# Patient Record
Sex: Female | Born: 1965 | Race: Black or African American | Hispanic: No | Smoking: Never smoker
Health system: Southern US, Community
[De-identification: ages and names within clinical notes are randomized; demographics above are authoritative.]

## PROBLEM LIST (undated history)

## (undated) DIAGNOSIS — Z87442 Personal history of urinary calculi: Secondary | ICD-10-CM

## (undated) DIAGNOSIS — J302 Other seasonal allergic rhinitis: Secondary | ICD-10-CM

## (undated) DIAGNOSIS — M1990 Unspecified osteoarthritis, unspecified site: Secondary | ICD-10-CM

## (undated) DIAGNOSIS — K219 Gastro-esophageal reflux disease without esophagitis: Secondary | ICD-10-CM

## (undated) DIAGNOSIS — T7840XA Allergy, unspecified, initial encounter: Secondary | ICD-10-CM

## (undated) DIAGNOSIS — N3010 Interstitial cystitis (chronic) without hematuria: Secondary | ICD-10-CM

## (undated) DIAGNOSIS — R51 Headache: Secondary | ICD-10-CM

## (undated) DIAGNOSIS — R519 Headache, unspecified: Secondary | ICD-10-CM

## (undated) DIAGNOSIS — F419 Anxiety disorder, unspecified: Secondary | ICD-10-CM

## (undated) HISTORY — PX: OTHER SURGICAL HISTORY: SHX169

## (undated) HISTORY — PX: FOOT SURGERY: SHX648

## (undated) HISTORY — DX: Allergy, unspecified, initial encounter: T78.40XA

---

## 1999-01-05 ENCOUNTER — Other Ambulatory Visit: Admission: RE | Admit: 1999-01-05 | Discharge: 1999-01-05 | Payer: Self-pay | Admitting: *Deleted

## 1999-05-25 ENCOUNTER — Inpatient Hospital Stay (HOSPITAL_COMMUNITY): Admission: EM | Admit: 1999-05-25 | Discharge: 1999-05-25 | Payer: Self-pay | Admitting: Obstetrics and Gynecology

## 1999-12-24 ENCOUNTER — Other Ambulatory Visit: Admission: RE | Admit: 1999-12-24 | Discharge: 1999-12-24 | Payer: Self-pay | Admitting: Obstetrics and Gynecology

## 2000-12-12 ENCOUNTER — Encounter: Payer: Self-pay | Admitting: Obstetrics and Gynecology

## 2000-12-12 ENCOUNTER — Ambulatory Visit (HOSPITAL_COMMUNITY): Admission: RE | Admit: 2000-12-12 | Discharge: 2000-12-12 | Payer: Self-pay | Admitting: Obstetrics and Gynecology

## 2001-05-08 ENCOUNTER — Encounter (INDEPENDENT_AMBULATORY_CARE_PROVIDER_SITE_OTHER): Payer: Self-pay

## 2001-05-08 ENCOUNTER — Ambulatory Visit (HOSPITAL_COMMUNITY): Admission: RE | Admit: 2001-05-08 | Discharge: 2001-05-08 | Payer: Self-pay | Admitting: Obstetrics and Gynecology

## 2001-09-29 ENCOUNTER — Other Ambulatory Visit: Admission: RE | Admit: 2001-09-29 | Discharge: 2001-09-29 | Payer: Self-pay | Admitting: Internal Medicine

## 2002-05-25 ENCOUNTER — Encounter: Payer: Self-pay | Admitting: Internal Medicine

## 2002-05-25 ENCOUNTER — Encounter: Admission: RE | Admit: 2002-05-25 | Discharge: 2002-05-25 | Payer: Self-pay | Admitting: Internal Medicine

## 2002-07-01 ENCOUNTER — Emergency Department (HOSPITAL_COMMUNITY): Admission: EM | Admit: 2002-07-01 | Discharge: 2002-07-02 | Payer: Self-pay | Admitting: Emergency Medicine

## 2002-09-25 ENCOUNTER — Other Ambulatory Visit: Admission: RE | Admit: 2002-09-25 | Discharge: 2002-09-25 | Payer: Self-pay | Admitting: Obstetrics and Gynecology

## 2003-07-22 ENCOUNTER — Ambulatory Visit (HOSPITAL_COMMUNITY): Admission: RE | Admit: 2003-07-22 | Discharge: 2003-07-22 | Payer: Self-pay | Admitting: Obstetrics & Gynecology

## 2003-07-22 ENCOUNTER — Encounter (INDEPENDENT_AMBULATORY_CARE_PROVIDER_SITE_OTHER): Payer: Self-pay | Admitting: Specialist

## 2003-09-05 ENCOUNTER — Inpatient Hospital Stay (HOSPITAL_COMMUNITY): Admission: AD | Admit: 2003-09-05 | Discharge: 2003-09-05 | Payer: Self-pay | Admitting: Obstetrics and Gynecology

## 2004-01-25 ENCOUNTER — Emergency Department (HOSPITAL_COMMUNITY): Admission: EM | Admit: 2004-01-25 | Discharge: 2004-01-26 | Payer: Self-pay | Admitting: *Deleted

## 2004-07-31 ENCOUNTER — Inpatient Hospital Stay (HOSPITAL_COMMUNITY): Admission: AD | Admit: 2004-07-31 | Discharge: 2004-07-31 | Payer: Self-pay | Admitting: Obstetrics and Gynecology

## 2004-08-05 ENCOUNTER — Inpatient Hospital Stay (HOSPITAL_COMMUNITY): Admission: AD | Admit: 2004-08-05 | Discharge: 2004-08-05 | Payer: Self-pay | Admitting: Obstetrics and Gynecology

## 2004-08-06 ENCOUNTER — Inpatient Hospital Stay (HOSPITAL_COMMUNITY): Admission: AD | Admit: 2004-08-06 | Discharge: 2004-08-10 | Payer: Self-pay | Admitting: Obstetrics and Gynecology

## 2004-08-07 ENCOUNTER — Encounter (INDEPENDENT_AMBULATORY_CARE_PROVIDER_SITE_OTHER): Payer: Self-pay | Admitting: Specialist

## 2005-05-26 ENCOUNTER — Encounter: Admission: RE | Admit: 2005-05-26 | Discharge: 2005-05-26 | Payer: Self-pay | Admitting: Internal Medicine

## 2006-07-29 ENCOUNTER — Encounter: Admission: RE | Admit: 2006-07-29 | Discharge: 2006-07-29 | Payer: Self-pay | Admitting: Allergy and Immunology

## 2006-08-03 ENCOUNTER — Encounter: Admission: RE | Admit: 2006-08-03 | Discharge: 2006-08-03 | Payer: Self-pay | Admitting: Allergy and Immunology

## 2006-08-18 ENCOUNTER — Ambulatory Visit: Payer: Self-pay | Admitting: Internal Medicine

## 2007-01-23 ENCOUNTER — Encounter: Admission: RE | Admit: 2007-01-23 | Discharge: 2007-01-23 | Payer: Self-pay | Admitting: Gastroenterology

## 2007-02-07 ENCOUNTER — Encounter: Admission: RE | Admit: 2007-02-07 | Discharge: 2007-02-07 | Payer: Self-pay | Admitting: Gastroenterology

## 2007-03-24 ENCOUNTER — Encounter (INDEPENDENT_AMBULATORY_CARE_PROVIDER_SITE_OTHER): Payer: Self-pay | Admitting: Urology

## 2007-03-24 ENCOUNTER — Ambulatory Visit (HOSPITAL_BASED_OUTPATIENT_CLINIC_OR_DEPARTMENT_OTHER): Admission: RE | Admit: 2007-03-24 | Discharge: 2007-03-24 | Payer: Self-pay | Admitting: Urology

## 2007-05-20 ENCOUNTER — Emergency Department (HOSPITAL_COMMUNITY): Admission: EM | Admit: 2007-05-20 | Discharge: 2007-05-20 | Payer: Self-pay | Admitting: Family Medicine

## 2007-06-23 ENCOUNTER — Encounter: Admission: RE | Admit: 2007-06-23 | Discharge: 2007-06-23 | Payer: Self-pay | Admitting: Internal Medicine

## 2007-07-04 ENCOUNTER — Encounter: Admission: RE | Admit: 2007-07-04 | Discharge: 2007-07-04 | Payer: Self-pay | Admitting: Internal Medicine

## 2007-08-17 ENCOUNTER — Emergency Department (HOSPITAL_COMMUNITY): Admission: EM | Admit: 2007-08-17 | Discharge: 2007-08-17 | Payer: Self-pay | Admitting: Family Medicine

## 2008-07-19 ENCOUNTER — Encounter: Admission: RE | Admit: 2008-07-19 | Discharge: 2008-07-19 | Payer: Self-pay | Admitting: Obstetrics and Gynecology

## 2008-12-22 IMAGING — CT CT ABDOMEN W/ CM
2 of 5 series · 17 of 46 positions shown, 19 images · IV contrast (READICAT/WATER & [ID] OMNI 300)
Comparison: None.

CLINICAL DATA: Abdominal pain, suprapubic pain.  
ABDOMEN CT WITH CONTRAST:
TECHNIQUE: Multidetector CT imaging of the abdomen was performed following the standard protocol during bolus administration of intravenous contrast.
Contrast:  100 cc Omnipaque 300
TECHNIQUE: Multidetector CT imaging of the pelvis was performed following the standard protocol during bolus administration of intravenous contrast.

[Series 3: routine abdomen · axial · 0.70mm/px · z∈[-400,-50]mm · 14 of 80 slices shown, 16 images]
[im 5/80  soft-tissue]
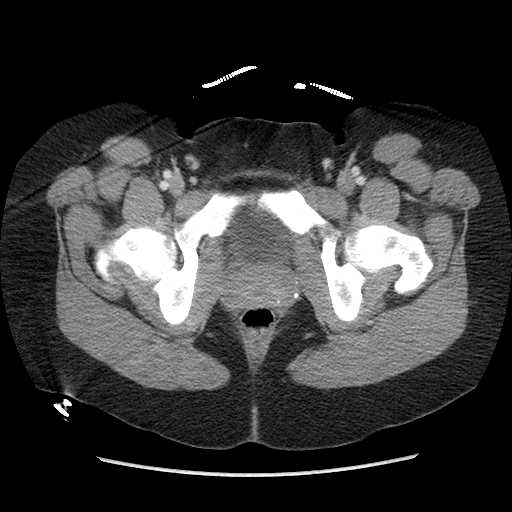
[im 5/80  bone]
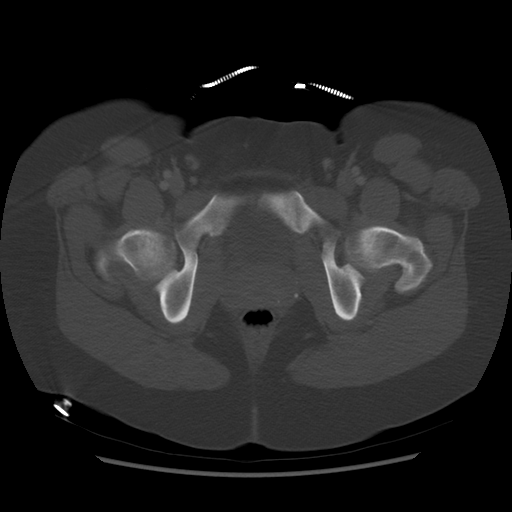
[im 10/80  soft-tissue]
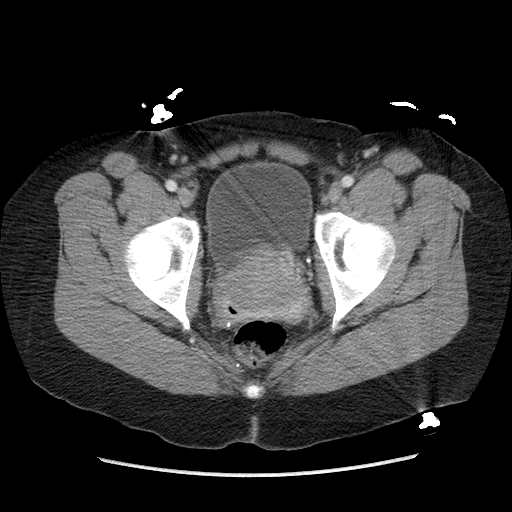
[im 14/80  soft-tissue]
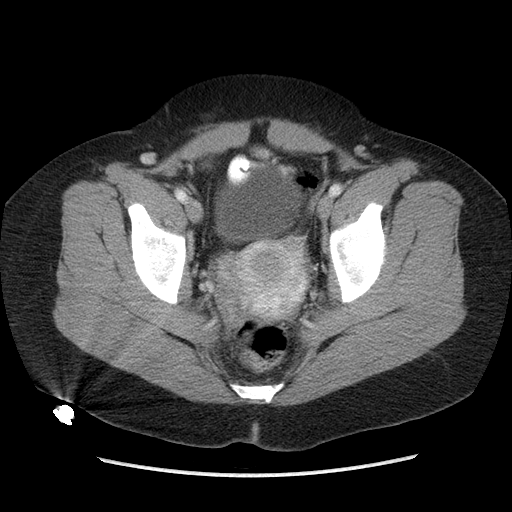
[im 24/80  soft-tissue]
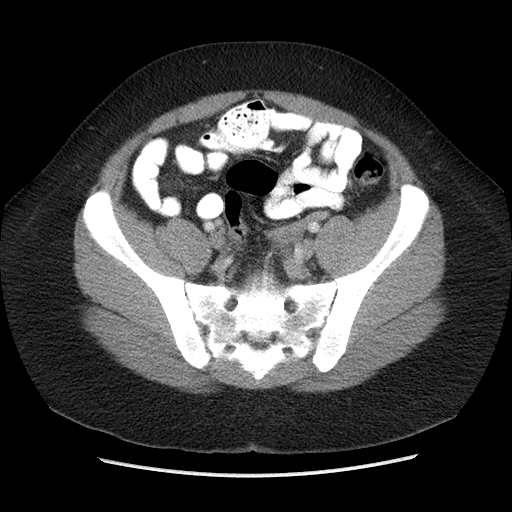
[im 28/80  soft-tissue]
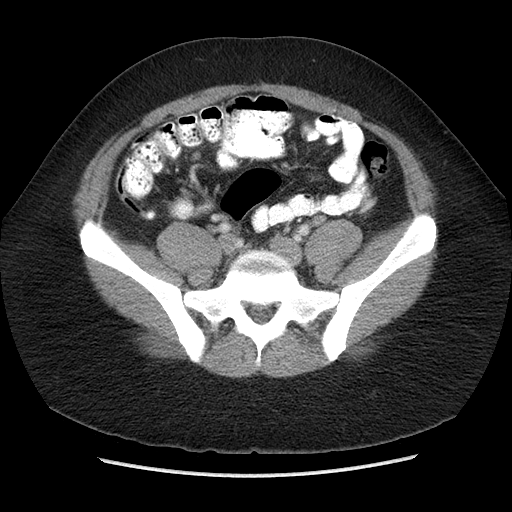
[im 33/80  soft-tissue]
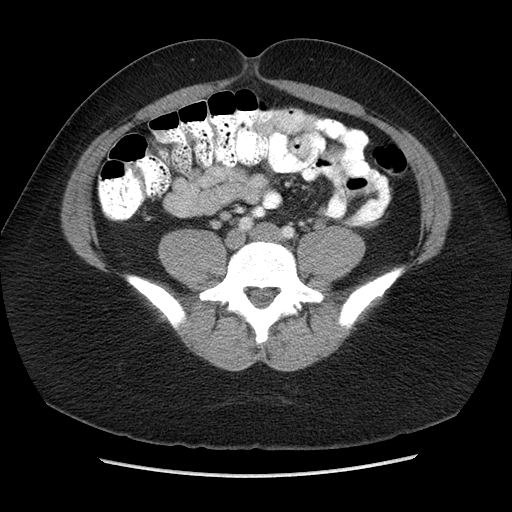
[im 38/80  soft-tissue]
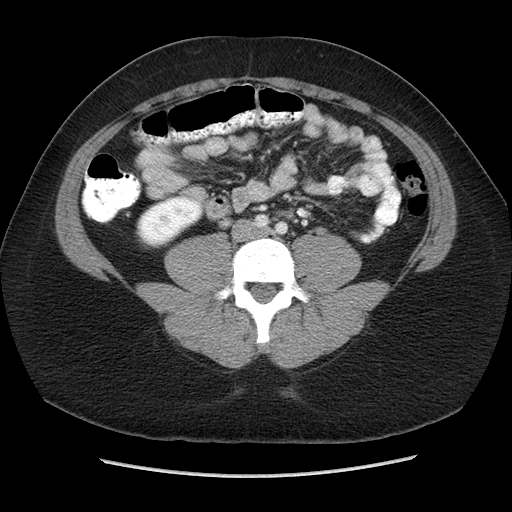
[im 42/80  soft-tissue]
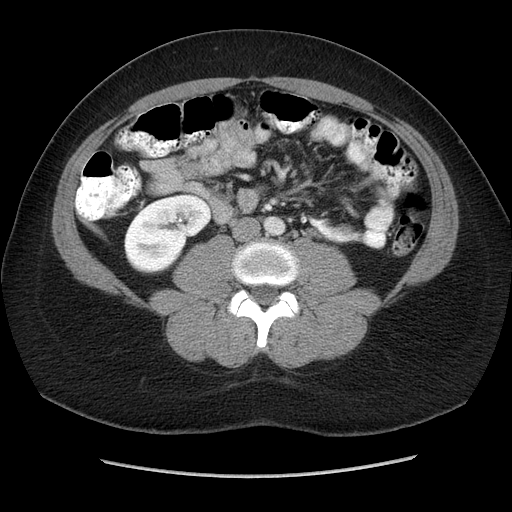
[im 47/80  soft-tissue]
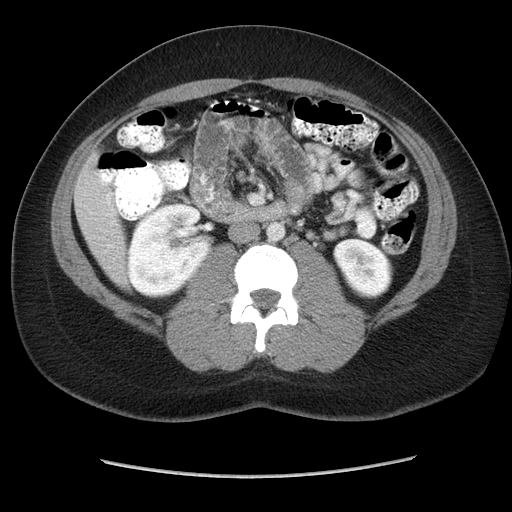
[im 47/80  bone]
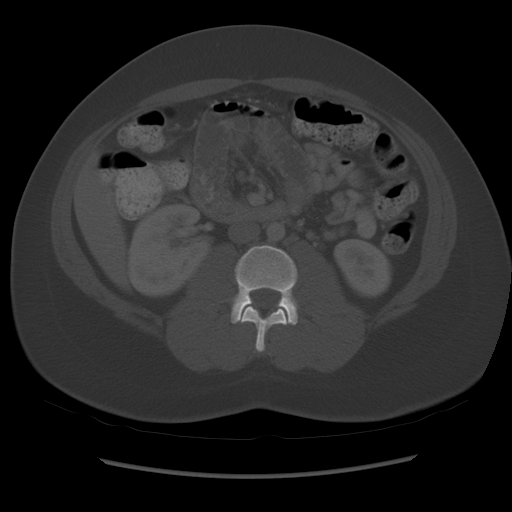
[im 52/80  soft-tissue]
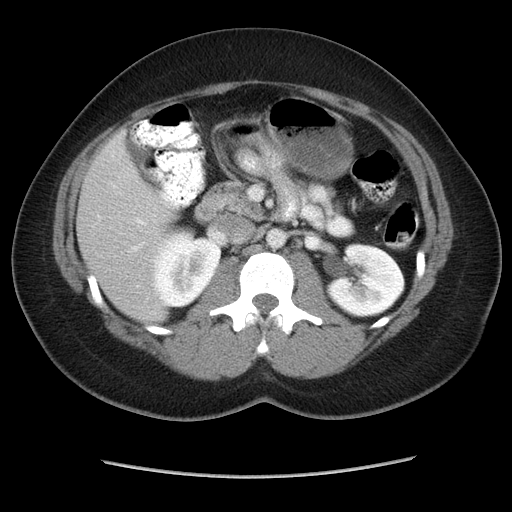
[im 61/80  soft-tissue]
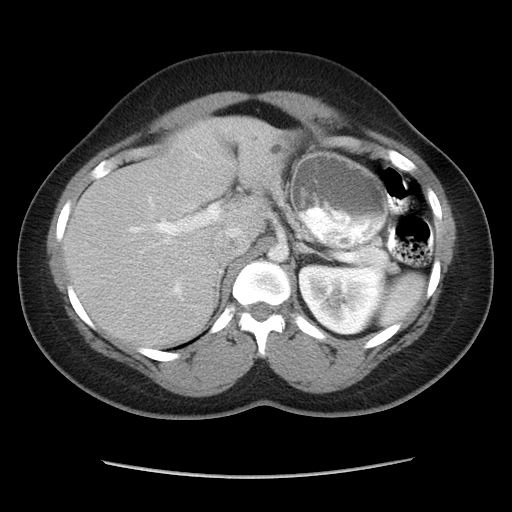
[im 66/80  soft-tissue]
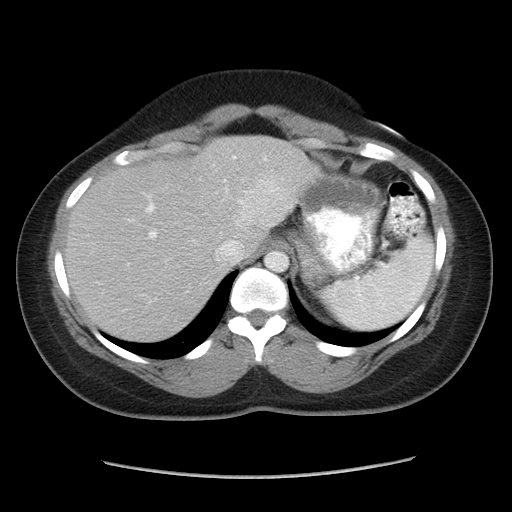
[im 70/80  soft-tissue]
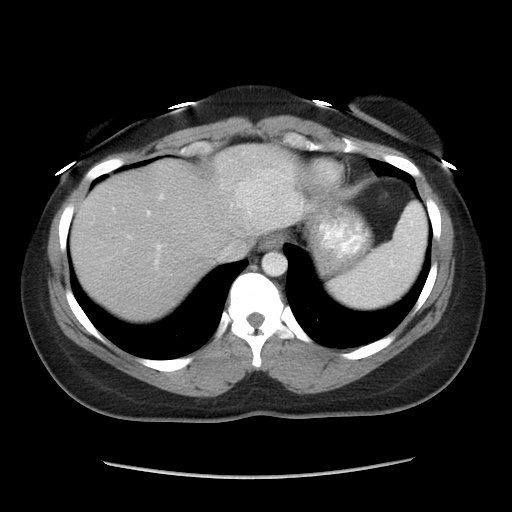
[im 75/80  soft-tissue]
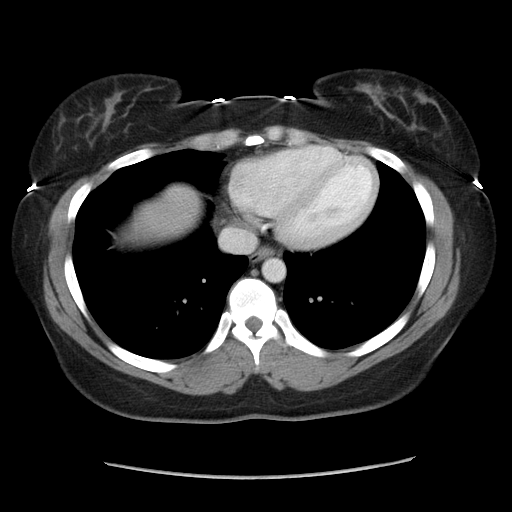

[Series 602: sagittal body · sagittal · 0.87mm/px · 3 of 145 slices shown]
[im 49/145  soft-tissue]
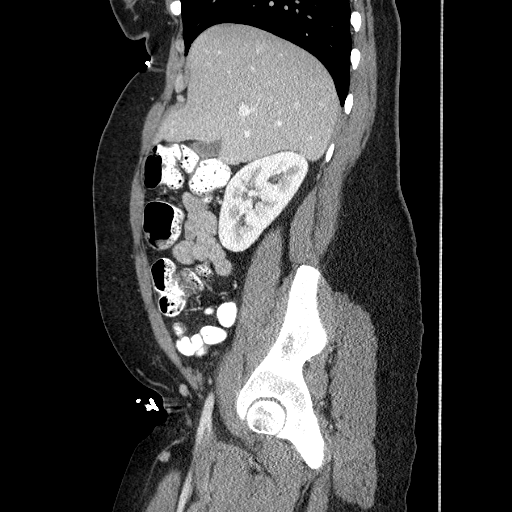
[im 65/145  soft-tissue]
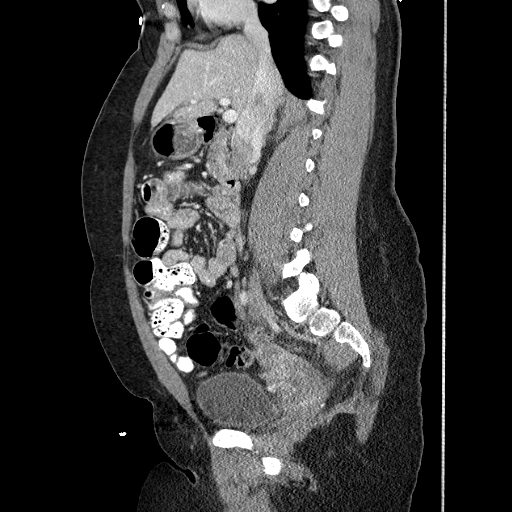
[im 81/145  soft-tissue]
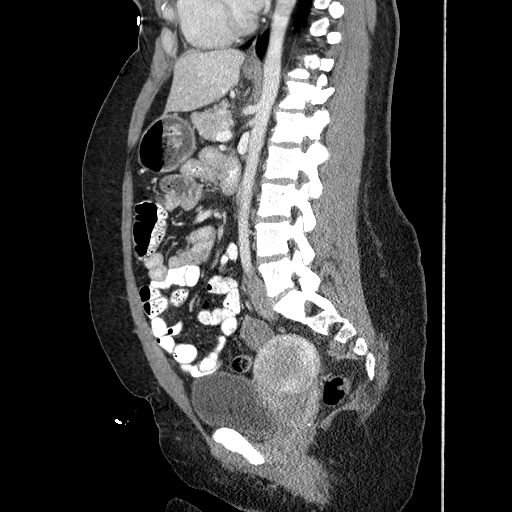

[17 of 46 positions shown; findings below may reference images not displayed]

FINDINGS: 5 mm low attenuation lesion in the left hepatic lobe is likely a cyst.  5.4 x 3.7 cm hyperattenuating lesion in the left hepatic lobe is seen w/ increased attenuation of the surrounding liver.  The liver, gallbladder, and right kidney are otherwise unremarkable.  There may be a small low attenuation lesion in the upper pole of the left kidney which is difficult to fully characterize on today?s study.  The spleen, pancreas, stomach, and small bowel unremarkable.  There are scattered unenlarged lymph nodes in the abdomen.
IMPRESSION: 1.  Hypervascular left hepatic lobe lesion with peripheral perfusion anomaly.  Recommend MR in further evaluation.
PELVIS CT WITH CONTRAST:
FINDINGS: Colon and appendix are unremarkable.  Uterine fundus and body appear distorted.  Ovaries are visualized.  No pathologically enlarged lymph nodes.  No free fluid.  No worrisome  lytic or sclerotic lesions.
IMPRESSION: Question uterine fullness/mass.  Ultrasound recommended.

## 2009-05-21 ENCOUNTER — Encounter: Admission: RE | Admit: 2009-05-21 | Discharge: 2009-05-21 | Payer: Self-pay | Admitting: Internal Medicine

## 2009-12-28 ENCOUNTER — Emergency Department (HOSPITAL_COMMUNITY): Admission: EM | Admit: 2009-12-28 | Discharge: 2009-12-28 | Payer: Self-pay | Admitting: Emergency Medicine

## 2010-06-12 ENCOUNTER — Encounter: Admission: RE | Admit: 2010-06-12 | Discharge: 2010-06-12 | Payer: Self-pay | Admitting: Internal Medicine

## 2010-08-07 ENCOUNTER — Encounter
Admission: RE | Admit: 2010-08-07 | Discharge: 2010-08-07 | Payer: Self-pay | Source: Home / Self Care | Attending: Internal Medicine | Admitting: Internal Medicine

## 2010-09-27 LAB — URINALYSIS, ROUTINE W REFLEX MICROSCOPIC
Bilirubin Urine: NEGATIVE
Glucose, UA: NEGATIVE mg/dL
Hgb urine dipstick: NEGATIVE
Ketones, ur: NEGATIVE mg/dL
Leukocytes, UA: NEGATIVE
Nitrite: NEGATIVE
Protein, ur: 100 mg/dL — AB
Specific Gravity, Urine: 1.023 (ref 1.005–1.030)
Urobilinogen, UA: 0.2 mg/dL (ref 0.0–1.0)
pH: 8.5 — ABNORMAL HIGH (ref 5.0–8.0)

## 2010-09-27 LAB — LIPASE, BLOOD: Lipase: 25 U/L (ref 11–59)

## 2010-09-27 LAB — URINE MICROSCOPIC-ADD ON

## 2010-09-27 LAB — POCT PREGNANCY, URINE: Preg Test, Ur: NEGATIVE

## 2010-09-27 LAB — COMPREHENSIVE METABOLIC PANEL
ALT: 14 U/L (ref 0–35)
AST: 16 U/L (ref 0–37)
Albumin: 3.6 g/dL (ref 3.5–5.2)
Alkaline Phosphatase: 71 U/L (ref 39–117)
BUN: 10 mg/dL (ref 6–23)
CO2: 21 mEq/L (ref 19–32)
Calcium: 9 mg/dL (ref 8.4–10.5)
Chloride: 107 mEq/L (ref 96–112)
Creatinine, Ser: 0.72 mg/dL (ref 0.4–1.2)
GFR calc Af Amer: >60 mL/min (ref >60)
GFR calc non Af Amer: >60 mL/min (ref >60)
Glucose, Bld: 122 mg/dL — ABNORMAL HIGH (ref 70–99)
Potassium: 3.6 mEq/L (ref 3.5–5.1)
Sodium: 136 mEq/L (ref 135–145)
Total Bilirubin: 0.5 mg/dL (ref 0.3–1.2)
Total Protein: 6.7 g/dL (ref 6.0–8.3)

## 2010-09-27 LAB — CBC
HCT: 41.4 % (ref 36.0–46.0)
Hemoglobin: 13.6 g/dL (ref 12.0–15.0)
MCHC: 32.8 g/dL (ref 30.0–36.0)
MCV: 87.3 fL (ref 78.0–100.0)
Platelets: 250 10*3/uL (ref 150–400)
RBC: 4.75 MIL/uL (ref 3.87–5.11)
RDW: 13.1 % (ref 11.5–15.5)
WBC: 9.6 10*3/uL (ref 4.0–10.5)

## 2010-09-27 LAB — DIFFERENTIAL
Basophils Absolute: 0 10*3/uL (ref 0.0–0.1)
Basophils Relative: 0 % (ref 0–1)
Eosinophils Absolute: 0.1 10*3/uL (ref 0.0–0.7)
Eosinophils Relative: 1 % (ref 0–5)
Lymphocytes Relative: 38 % (ref 12–46)
Lymphs Abs: 3.7 10*3/uL (ref 0.7–4.0)
Monocytes Absolute: 0.6 10*3/uL (ref 0.1–1.0)
Monocytes Relative: 6 % (ref 3–12)
Neutro Abs: 5.2 10*3/uL (ref 1.7–7.7)
Neutrophils Relative %: 54 % (ref 43–77)

## 2010-09-27 LAB — WET PREP, GENITAL
Clue Cells Wet Prep HPF POC: NONE SEEN
Trich, Wet Prep: NONE SEEN
Yeast Wet Prep HPF POC: NONE SEEN

## 2010-09-27 LAB — GC/CHLAMYDIA PROBE AMP, GENITAL
Chlamydia, DNA Probe: NEGATIVE
GC Probe Amp, Genital: NEGATIVE

## 2010-11-11 ENCOUNTER — Other Ambulatory Visit: Payer: Self-pay | Admitting: Obstetrics and Gynecology

## 2010-11-24 NOTE — Op Note (Signed)
Samantha Mcgee, Samantha Mcgee            ACCOUNT NO.:  [id number]   MEDICAL RECORD NO.:  [id number]          PATIENT TYPE:  AMB   LOCATION:  [facility]                         FACILITY:  [facility]   PHYSICIAN:  Sigmund I. Patsi Sears, M.D.DATE OF BIRTH:  September 21, 1965   DATE OF PROCEDURE:  03/24/2007  DATE OF DISCHARGE:                               OPERATIVE REPORT   PREOPERATIVE DIAGNOSIS:  Interstitial cystitis.   POSTOPERATIVE DIAGNOSIS:  Interstitial cystitis.   OPERATION:  1. Cystourethroscopy.  2. Hydrodistention of the bladder.  3. Bladder biopsy.  4. Cauterization of the biopsy sites.  5. Instillation of Marcaine and Pyridium in the bladder.  6. Injection of Marcaine and Kenalog in the subtrigonal space.   SURGEON:  Sigmund I. Patsi Sears, M.D.   PREPARATION:  After appropriate preanesthesia, the patient was brought  to the operating room, placed in the operating room in a dorsal supine  position, where general LMA anesthesia was introduced.  She was replaced  in dorsal lithotomy position, where the pubis was prepped with Betadine  solution and draped in the usual fashion.   REVIEW OF HISTORY:  [name] is a 45 year old female complaining of low  back pain, nausea, abdominal cramping, with mild urge and urge  incontinence.  She had an O'Leary-Sant symptom score sheet of 9.  She is  now for cystoscopy, HOD, bladder biopsy   PROCEDURE:  Cystourethroscopy was accomplished, which shows a normal-  appearing urethra.  Cystoscopy shows normal bladder with 800-mL  capacity, stretched to 850 mL with hydrodistention.  Cold-cup bladder  biopsies taken and cauterization of the biopsy sites is obtained.  The  bladder wall appears normal with minimal amount of ulcerations.  Pyridium and Marcaine is instilled bladder, and Marcaine and Kenalog is  injected in the subtrigonal space.  The patient tolerated the procedure  well.  She was given Toradol IV.  She received a B&O suppository as  well.      Sigmund I. Patsi Sears, M.D.  Electronically Signed     SIT/MEDQ  D:  03/24/2007  T:  03/25/2007  Job:  [id number]

## 2010-11-27 NOTE — Discharge Summary (Signed)
Samantha Mcgee, Samantha Mcgee            ACCOUNT NO.:  [id number]   MEDICAL RECORD NO.:  [id number]          PATIENT TYPE:  INP   LOCATION:  9148                          FACILITY:  [facility]   PHYSICIAN:  Genia Del, M.D.DATE OF BIRTH:  08/11/1965   DATE OF ADMISSION:  08/06/2004  DATE OF DISCHARGE:  08/10/2004                                 DISCHARGE SUMMARY   ADMISSION DIAGNOSES:  1.  Thirty-eight-plus weeks.  2.  Polyhydramnios.  3.  Repetitive deep prolonged decelerations.  4.  Failure to progress.   DISCHARGE DIAGNOSES:  1.  Thirty-eight-plus weeks.  2.  Polyhydramnios.  3.  Repetitive deep prolonged decelerations.  4.  Failure to progress.  5.  Intervention, urgent low transverse primary cesarean section.  6.  Birth of a baby girl, Apgars 9 and 9, pH 7.26.   HOSPITAL COURSE:  Normal hemoglobin 10.3.  The patient was discharged in  good status on postoperative day #3.  Discharge advice was given. The  patient was prescribed Motrin and Tylox p.r.n.  Prenatal vitamins and iron  supplements were suggested.  She will follow up postoperative in four weeks  at the office.      ML/MEDQ  D:  09/03/2004  T:  09/03/2004  Job:  [id number]

## 2010-11-27 NOTE — Op Note (Signed)
Samantha Mcgee, Samantha Mcgee            ACCOUNT NO.:  [id number]   MEDICAL RECORD NO.:  [id number]          PATIENT TYPE:  INP   LOCATION:  9148                          FACILITY:  [facility]   PHYSICIAN:  Genia Del, M.D.DATE OF BIRTH:  Feb 13, 1966   DATE OF PROCEDURE:  08/07/2004  DATE OF DISCHARGE:                                 OPERATIVE REPORT   OPERATIVE PROTOCOL:  1.  38+ weeks' gestation.  2.  Polyhydramnios.  3.  Repetitive, deep prolonged decelerations.  4.  Failure to progress.   POSTOPERATIVE DIAGNOSES:  1.  38+ weeks' gestation.  2.  Polyhydramnios.  3.  Repetitive, deep prolonged decelerations.  4.  Failure to progress.   INTERVENTION:  Urgent, low-transverse C-section, primary.   SURGEON:  Genia Del, M.D.   ASSISTANT:  Maxie Better, M.D.   ANESTHESIOLOGIST:  Dr. Jean Rosenthal.   PROCEDURE IN DETAIL:  After epidural anesthesia, the patient was placed in  the 15 degree left decubitus position.  She is prepped with Betadine on the  abdominal, suprapubic, vulvar area.  The bladder catheter was already in  place and the patient is draped as usual.  After verifying the level of  anesthesia, we proceeded with a Pfannenstiel incision with scalpel.  We  opened the adipose with the scalpel and the aponeurosis with the scalpel and  further opening of the incision is done on each side with Mayo scissors and  the electrocautery.  We then separated the rectus muscles from the  aponeurosis on the midline superiorly and inferiorly.  We opened the  parietal peritoneum bluntly and then put the bladder retractor in place.  We  opened the visceral peritoneum over the lower uterine segment with the [facility]  scissors.  The bladder is reclined downward.  The low transverse hysterotomy  is done with the scalpel.  We opened on each side with dressing scissors.  The amniotic fluid is clear.  The fetus is in OP presentation.  Birth of a  baby girl.  The cord is clamped and cut.  The  baby is suctioned and given to  the neonatal team.  Apgars are 9 and 9.  pH is 7.26.  The cord blood is  taken.  The placenta is evacuated spontaneously and sent to pathology.  Uterine revision done and Pitocin is started in the IV fluid.  A dose of  Cefotan is given IV.  We examined the uterus.  It is increased in volume,  well contracted.  The increase in volume is due to multiple myoma.  Both  ovaries are normal to inspection.  Both tubes are normal.  We closed the  hysterotomy in a first locked, running suture of Vicryl 0.  We made a second  layer in a mattress fashion with Vicryl 0.  Hemostasis is adequate.  We then  irrigated and suctioned the abdominopelvic cavity.  Hemostasis is completed  on the rectus muscles and aponeurosis with the electrocautery.  We closed  the aponeurosis with two half running sutures of Vicryl 0.  Hemostasis was  completed on the adipose  tissue with the electrocautery.  The skin is reapproximated  with staples.  A  dry dressing is applied.  The count of sponges and instruments was complete  x2.  No complications occurred.  The estimated blood loss was 700 mL.  The  patient was transferred to recovery room in good status.      ML/MEDQ  D:  08/07/2004  T:  08/08/2004  Job:  [id number]

## 2010-11-27 NOTE — Op Note (Signed)
[facility]  Patient:    Samantha Mcgee, Samantha Mcgee Visit Number: [id number] MRN: [id number]          Service Type: DSU Location: [facility] Attending Physician:  Shaune Spittle Proc. Date: 05/08/01 Admit Date:  05/08/2001                             Operative Report  PREOPERATIVE DIAGNOSIS:       Pelvic pain.  POSTOPERATIVE DIAGNOSIS:      Pelvic pain.  Small posterior uterine fibroid, endometriosis, and right ovarian endometrioma.  OPERATION:                    Operative laparoscopy, right ovarian cystectomy, lysis of adhesions.  SURGEON:                      Vanessa P. Haygood, M.D.  ASSISTANT:  ANESTHESIA:                   General orotracheal.  ESTIMATED BLOOD LOSS:         Less than 50 cc.  COMPLICATIONS:                None.  FINDINGS:                     The uterus was upper limits of normal size with a 1 cm myoma on the posterior right fundus.  The tubes were normal bilaterally. The left ovary contained a corpus luteum cyst.  The right ovary contained an endometrioma and was stuck to the right uterosacral ligament with adhesions.  The appendix tip appeared normal, though, the remainder was retrocecal.  DESCRIPTION OF PROCEDURE:     The patient was taken to the operating room and placed on the operating table.  After the attainment of adequate general anesthesia, she was placed in the modified lithotomy position.  The abdomen, perineum, and vagina were prepped with multiple layers of Betadine, and a Foley catheter inserted into the bladder and connected to straight drainage. A single tooth tenaculum was placed on the anterior cervix and the abdomen draped as a sterile field.  Subumbilical injection of 0.25% Marcaine was undertaken as was suprapubic injection for a total of 10 cc.  A subumbilical incision was made and a Veress cannula placed through that incision into the peritoneal cavity.  A pneumoperitoneum was created with 2.5  liters of CO2. The Veress cannula was removed and the laparoscopic trocar placed through that incision into the peritoneal cavity.  The laparoscope was placed through the trocar sleeve.  Suprapubic incisions were made to the right and left of midline and laparoscopic probe trocars placed through those incisions into the peritoneal cavity under direct visualization.  The above noted findings were made and documented.  The right ovary was then dissected off the right uterosacral ligament and elevated into the operative field.  It was held with the grasper and with sharp dissection, the right ovarian endometrioma was excised with a combination of hydrodissection and sharp dissection.  It was then removed from the operative field through the umbilical trocar sleeve. That trocar sleeve was then copiously irrigated.  Copious irrigation of the endometrioma bed revealed that hemostasis was noted to be adequate and a large volume of warm Ringers lactate was left in the peritoneal cavity.  All instruments were then removed from the peritoneal cavity under direct visualization as the  CO2 was allowed to escape.  The subumbilical and suprapubic incisions were then closed with subcuticular sutures of 3-0 Vicryl. The single tooth tenaculum was removed as was the Foley catheter.  The patient was awakened from general anesthesia and taken to the recovery room in stable condition having tolerated the procedure well with sponge, needle, and instrument counts were correct. Attending Physician:  Shaune Spittle DD:  05/08/01 TD:  05/09/01 Job: [id number]

## 2010-11-27 NOTE — Assessment & Plan Note (Signed)
[facility]                             PULMONARY OFFICE NOTE   NAME:Samantha, Mcgee                   MRN:          [id number]  DATE:08/18/2006                            DOB:          12-Jun-1966    REFERRING PHYSICIAN:  Jessica Priest, M.D.   HISTORY:  [age]-year-old black female says she has been bothered  by sweats and fatigue for over a year, but abruptly about a month ago  developed right shoulder discomfort that was worse with deep breathing  and moving her shoulder around on the right.  It was made better by  ibuprofen.  She had one episode of hemoptysis, or what she describes as  traces of bright red blood a few weeks ago, and underwent a chest x-ray  showing a vague infiltrate in the right upper lobe which was an air  space opacity on CT scan, and subsequently was treated with 10 days of  Avelox by Dr. Lucie Leather, and is now seen at Dr. Kathyrn Lass request on the  behalf of Dr. Willa Rough.   The patient says she really has not been doing well over the last year  with symptoms mostly of coughing and dyspnea with no improvement on  Asmanex, Nasonex, Clarinex to date.  She describes herself as having  problems with allergies since she was in her 24s consisting of itching,  sneezing and runny nose, and has been identified as allergic to pollen  and grass but is not on allergy shots at present.   The patient denies any weight loss or documented fever, but just has  saturating sweats pretty much every night.  She has paroxysms of  dyspnea that do not necessarily respond to albuterol, and at times feels  she is choking.   PAST MEDICAL HISTORY:  Significant for asthma, allergies and chronic  headache as well as remote foot surgery.   ALLERGIES:  None known.   MEDICATIONS:  Asmanex, Nasonex, Clarinex, Xopenex, ibuprofen and  Benadryl.   SOCIAL HISTORY:  She quit smoking in 1992.  She works as a Lobbyist.   FAMILY HISTORY:  Recorded  in detail and is significant for asthma and  allergies in her father.  Negative for cancer in any immediate family  members nor tuberculosis.   REVIEW OF SYSTEMS:  Taken in detail on the worksheet and significant for  the problems as outlined above.   PHYSICAL EXAMINATION:  This is a very animated black female, seems to  struggle quite a bit with answering questions in a straightforward  fashion.  She is afebrile, stable vital signs.  HEENT:  Unremarkable, oropharynx is clear.  There was no turbinate edema  or sinus pallor or polyps.  Oropharynx was clear with no excessive  postnasal drainage or cobblestoning.  Dentition was intact.  Ear canals  were clear bilaterally.  NECK:  Supple without cervical adenopathy or tenderness.  The trachea  was midline, no thyromegaly.  LUNGS:  Lung fields are perfectly clear bilaterally to auscultation and  percussion.  CARDIAC:  Appears regular rhythm without murmur, gallop or rub.  ABDOMEN:  Soft, benign.  EXTREMITIES:  Warm without calf tenderness, cyanosis, clubbing or edema.   Chest x-ray was repeated today and is normal.  PFTs were repeated today  and show only an abnormal inspiratory contour.   IMPRESSION:  1. 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      Because she carries the diagnosis of chronic asthma, I am going to      take the liberty of switching her from Asmanex, which is one of the      most irritating of the dry powdered inhalers, to QVAR, which is the      least irritating of the aerosols available, at a dose of QVAR 40      two puffs twice daily.  I spent extra time teaching the patient how      to use this effectively.  2. Because she has so much upper airway instability I recommended      Zegerid 40 milligrams at bedtime for a 4-week trial.  3. I have emphasized that she should continue Nasonex under Dr. Willa Rough'      direction  perfectly regularly and use Allegra or Claritin (but not      both) as needed itching and sneezing, for cough use Delsym and      supplement with tramadol, for wheezing and dyspnea use Xopenex 2      puffs every 4 hours (technique reviewed), and for chest discomfort      use Tylenol or tramadol, but avoid ibuprofen for now to see if the      symptom flares off of ibuprofen (which would probably indicate a      musculoskeletal complaint).   In terms of the infiltrate on chest x-ray and CT scan, since it was  plainly seen before and now is a normal exam, most likely this was  inflammatory.   In terms of the chronic sweats she has, I do not believe they represent  a chronic infection in that she has not had documented fever or weight  loss.  I do note that she has tachycardia and a very unusual affect and  attitude, and wonder if she not be hyperthyroid and/or overusing  decongestants or beta 2 agonists.  I would recommend a CBC with  differential, sed rate and TSH if not already obtained by Dr. Willa Rough (she  tells me blood work was already obtained at Dr. Kathyrn Lass office in the  last 2 weeks, we will check the results of this before we add additional  studies).     Charlaine Dalton. Sherene Sires, MD, [facility]  Electronically Signed    MBW/MedQ  DD: 08/18/2006  DT: 08/19/2006  Job #: [id number]   cc:   Meliton Rattan. Willa Rough, M.D.

## 2010-11-27 NOTE — Op Note (Signed)
NAME:  Samantha Mcgee, Samantha Mcgee                      ACCOUNT NO.:  [id number]   MEDICAL RECORD NO.:  [id number]                   PATIENT TYPE:  AMB   LOCATION:  SDC                                  FACILITY:  [facility]   PHYSICIAN:  Gerri Spore B. Earlene Plater, M.D.               DATE OF BIRTH:  1966-07-08   DATE OF PROCEDURE:  07/22/2003  DATE OF DISCHARGE:                                 OPERATIVE REPORT   PREOPERATIVE DIAGNOSIS:  A 6 week incomplete abortion.   POSTOPERATIVE DIAGNOSIS:  A 6 week incomplete abortion.   PROCEDURE:  Suction curettage, ultrasound guided.   SURGEON:  Chester Holstein. Earlene Plater, M.D.   ANESTHESIA:  MAC and 20 mL of 2% Nesacaine paracervical block.   FINDINGS:  Exam under anesthesia, a 6-7 week size anteverted uterus.  No  masses palpable.  Ultrasound consistent with incomplete AB performed in the  operating room.   SPECIMENS:  Products of conception.   COMPLICATIONS:  None.   INDICATION:  Patient with known 6 weeks pregnancy loss, planned for suction  curettage tomorrow.  Presented by EMS with complaints of severe lower  abdominal pain and some associated bleeding.  Was in tremendous pain and  requested we proceed immediately with a D&C.   The risks of the procedure were discussed including infection, bleeding,  uterine perforation, damage to surrounding organs, and retained products.  All questions answered.  The patient wished to proceed.   DESCRIPTION OF PROCEDURE:  The patient was taken to the operating room and  MAC anesthesia obtained.  She was prepped and draped in a standard fashion.  An exam under anesthesia showed a 6-7 week size, anteverted uterus without  masses palpable.  There was some tissue already through the cervix within  the vaginal vault.  Ultrasound showed evidence of some additional tissue in  the uterus.   Speculum inserted, paracervical block placed, the anterior lip of the cervix  grasped with a single-tooth.  The cervix was already dilated enough  to  permit passage of the #7 curved cannula.  This was performed under  ultrasound and suction used to complete the miscarriage.  The uterus was  then gently curetted, and a small amount of additional tissue returned.  One  more pass of the suction returned no products, and an ultrasound of the  uterus looked completely evacuated.  Therefore, the procedure was  terminated.   The single-tooth was removed, and the cervix was hemostatic.  The patient  was taken to the recovery room, awake and alert in a stable condition.  The  patient's blood type was known to be B positive.                                               Gerri Spore B. Earlene Plater, M.D.    WBD/MEDQ  D:  07/22/2003  T:  07/22/2003  Job:  [id number]

## 2011-04-23 LAB — POCT HEMOGLOBIN-HEMACUE
Hemoglobin: 13.5
Operator id: 11453

## 2011-04-23 LAB — POCT PREGNANCY, URINE
Operator id: 268271
Preg Test, Ur: NEGATIVE

## 2011-05-31 ENCOUNTER — Other Ambulatory Visit: Payer: Self-pay | Admitting: Internal Medicine

## 2011-05-31 DIAGNOSIS — Z1231 Encounter for screening mammogram for malignant neoplasm of breast: Secondary | ICD-10-CM

## 2011-06-22 ENCOUNTER — Ambulatory Visit
Admission: RE | Admit: 2011-06-22 | Discharge: 2011-06-22 | Disposition: A | Discharge status: Home / Self Care | Payer: 59 | Source: Ambulatory Visit | Attending: Internal Medicine | Admitting: Internal Medicine

## 2011-06-22 DIAGNOSIS — Z1231 Encounter for screening mammogram for malignant neoplasm of breast: Secondary | ICD-10-CM

## 2011-08-10 ENCOUNTER — Emergency Department (HOSPITAL_COMMUNITY)
Admission: EM | Admit: 2011-08-10 | Discharge: 2011-08-10 | Disposition: A | Discharge status: Home / Self Care | Payer: 59 | Attending: Emergency Medicine | Admitting: Emergency Medicine

## 2011-08-10 ENCOUNTER — Emergency Department (HOSPITAL_COMMUNITY): Payer: 59

## 2011-08-10 ENCOUNTER — Encounter (HOSPITAL_COMMUNITY): Payer: Self-pay | Admitting: Emergency Medicine

## 2011-08-10 DIAGNOSIS — N200 Calculus of kidney: Secondary | ICD-10-CM | POA: Insufficient documentation

## 2011-08-10 DIAGNOSIS — F329 Major depressive disorder, single episode, unspecified: Secondary | ICD-10-CM | POA: Insufficient documentation

## 2011-08-10 DIAGNOSIS — M545 Low back pain, unspecified: Secondary | ICD-10-CM | POA: Insufficient documentation

## 2011-08-10 DIAGNOSIS — Z79899 Other long term (current) drug therapy: Secondary | ICD-10-CM | POA: Insufficient documentation

## 2011-08-10 DIAGNOSIS — R110 Nausea: Secondary | ICD-10-CM | POA: Insufficient documentation

## 2011-08-10 DIAGNOSIS — F3289 Other specified depressive episodes: Secondary | ICD-10-CM | POA: Insufficient documentation

## 2011-08-10 DIAGNOSIS — N134 Hydroureter: Secondary | ICD-10-CM | POA: Insufficient documentation

## 2011-08-10 DIAGNOSIS — N201 Calculus of ureter: Secondary | ICD-10-CM | POA: Insufficient documentation

## 2011-08-10 DIAGNOSIS — R6883 Chills (without fever): Secondary | ICD-10-CM | POA: Insufficient documentation

## 2011-08-10 DIAGNOSIS — J45909 Unspecified asthma, uncomplicated: Secondary | ICD-10-CM | POA: Insufficient documentation

## 2011-08-10 DIAGNOSIS — R1031 Right lower quadrant pain: Secondary | ICD-10-CM | POA: Insufficient documentation

## 2011-08-10 HISTORY — DX: Other seasonal allergic rhinitis: J30.2

## 2011-08-10 HISTORY — DX: Interstitial cystitis (chronic) without hematuria: N30.10

## 2011-08-10 LAB — URINALYSIS, ROUTINE W REFLEX MICROSCOPIC
Bilirubin Urine: NEGATIVE
Glucose, UA: NEGATIVE mg/dL
Ketones, ur: NEGATIVE mg/dL
Nitrite: NEGATIVE
Protein, ur: 30 mg/dL — AB
Specific Gravity, Urine: 1.025 (ref 1.005–1.030)
Urobilinogen, UA: 0.2 mg/dL (ref 0.0–1.0)
pH: 7 (ref 5.0–8.0)

## 2011-08-10 LAB — CBC
HCT: 35.5 % — ABNORMAL LOW (ref 36.0–46.0)
Hemoglobin: 12 g/dL (ref 12.0–15.0)
MCH: 28 pg (ref 26.0–34.0)
MCHC: 33.8 g/dL (ref 30.0–36.0)
MCV: 82.9 fL (ref 78.0–100.0)
Platelets: 286 10*3/uL (ref 150–400)
RBC: 4.28 MIL/uL (ref 3.87–5.11)
RDW: 13.1 % (ref 11.5–15.5)
WBC: 10.1 10*3/uL (ref 4.0–10.5)

## 2011-08-10 LAB — DIFFERENTIAL
Basophils Absolute: 0 10*3/uL (ref 0.0–0.1)
Basophils Relative: 0 % (ref 0–1)
Eosinophils Absolute: 0.1 10*3/uL (ref 0.0–0.7)
Eosinophils Relative: 1 % (ref 0–5)
Lymphocytes Relative: 17 % (ref 12–46)
Lymphs Abs: 1.8 10*3/uL (ref 0.7–4.0)
Monocytes Absolute: 0.9 10*3/uL (ref 0.1–1.0)
Monocytes Relative: 9 % (ref 3–12)
Neutro Abs: 7.3 10*3/uL (ref 1.7–7.7)
Neutrophils Relative %: 72 % (ref 43–77)

## 2011-08-10 LAB — COMPREHENSIVE METABOLIC PANEL
ALT: 11 U/L (ref 0–35)
AST: 13 U/L (ref 0–37)
Albumin: 3.5 g/dL (ref 3.5–5.2)
Alkaline Phosphatase: 64 U/L (ref 39–117)
BUN: 14 mg/dL (ref 6–23)
CO2: 22 mEq/L (ref 19–32)
Calcium: 9.2 mg/dL (ref 8.4–10.5)
Chloride: 105 mEq/L (ref 96–112)
Creatinine, Ser: 0.86 mg/dL (ref 0.50–1.10)
GFR calc Af Amer: >90 mL/min (ref >90)
GFR calc non Af Amer: 81 mL/min — ABNORMAL LOW (ref >90)
Glucose, Bld: 104 mg/dL — ABNORMAL HIGH (ref 70–99)
Potassium: 3.6 mEq/L (ref 3.5–5.1)
Sodium: 138 mEq/L (ref 135–145)
Total Bilirubin: 0.3 mg/dL (ref 0.3–1.2)
Total Protein: 6.7 g/dL (ref 6.0–8.3)

## 2011-08-10 LAB — URINE MICROSCOPIC-ADD ON

## 2011-08-10 LAB — PREGNANCY, URINE: Preg Test, Ur: NEGATIVE

## 2011-08-10 MED ORDER — HYDROMORPHONE HCL PF 1 MG/ML IJ SOLN
1.0000 mg | Freq: Once | INTRAMUSCULAR | Status: AC
  Administered 2011-08-10: 1 mg via INTRAVENOUS
  Filled 2011-08-10: qty 1

## 2011-08-10 MED ORDER — ONDANSETRON HCL 4 MG/2ML IJ SOLN
4.0000 mg | Freq: Once | INTRAMUSCULAR | Status: AC
  Administered 2011-08-10: 4 mg via INTRAVENOUS
  Filled 2011-08-10: qty 2

## 2011-08-10 MED ORDER — ONDANSETRON 8 MG PO TBDP: 8.0000 mg | ORAL_TABLET | Freq: Three times a day (TID) | ORAL | Status: AC | PRN

## 2011-08-10 MED ORDER — OXYCODONE-ACETAMINOPHEN 5-325 MG PO TABS: 1.0000 | ORAL_TABLET | Freq: Four times a day (QID) | ORAL | Status: AC | PRN

## 2011-08-10 MED ORDER — MORPHINE SULFATE 4 MG/ML IJ SOLN
4.0000 mg | Freq: Once | INTRAMUSCULAR | Status: AC
  Administered 2011-08-10: 4 mg via INTRAVENOUS
  Filled 2011-08-10: qty 1

## 2011-08-10 MED ORDER — SODIUM CHLORIDE 0.9 % IV BOLUS (SEPSIS)
1000.0000 mL | Freq: Once | INTRAVENOUS | Status: AC
  Administered 2011-08-10: 1000 mL via INTRAVENOUS

## 2011-08-10 NOTE — ED Provider Notes (Signed)
History     CSN: [id number]  Arrival date & time 08/10/11  0453   First MD Initiated Contact with Patient 08/10/11 [phone]      Chief Complaint  Patient presents with  . Back Pain    (Consider location/radiation/quality/duration/timing/severity/associated sxs/prior treatment) HPI Comments: Patient reports that she began having lower right sided back pain four days ago.  The pain radiated to her RLQ.  She reports that at approximately 3 hours ago the pain became much worse.  She took two Midol, but does not feel that it helped at all.  She initially felt that the pain was menstrual cramping, but reports that it is now much worse than her typical menstrual pain.  LMP 08/06/11.  She reports that she is feeling nauseous.  Denies vomiting.  Denies fever.  Denies dysuria.  She reports that she has noticed some hematuria.  PMH significant for kidney stones approximately 20 years ago.  No prior history of ectopic pregnancy.  Patient is a 46 y.o. female presenting with back pain. The history is provided by the patient.  Back Pain  Pertinent negatives include no chest pain, no fever and no dysuria.    Past Medical History  Diagnosis Date  . Interstitial cystitis   . Asthma   . Seasonal allergies   . Depression     Past Surgical History  Procedure Date  . Cesarean section   . Cystitis   . Foot surgery     No family history on file.  History  Substance Use Topics  . Smoking status: Never Smoker   . Smokeless tobacco: Not on file  . Alcohol Use: Yes    OB History    Grav Para Term Preterm Abortions TAB SAB Ect Mult Living                  Review of Systems  Constitutional: Positive for chills. Negative for fever and diaphoresis.  Respiratory: Negative for shortness of breath.   Cardiovascular: Negative for chest pain.  Gastrointestinal: Positive for nausea. Negative for vomiting, diarrhea and constipation.  Genitourinary: Positive for hematuria. Negative for dysuria, vaginal  discharge, difficulty urinating and dyspareunia.  Musculoskeletal: Positive for back pain. Negative for gait problem.  Skin: Negative for rash.  Neurological: Negative for syncope.    Allergies  Review of patient's allergies indicates no known allergies.  Home Medications   Current Outpatient Rx  Name Route Sig Dispense Refill  . MIDOL COMPLETE PO Oral Take 2 tablets by mouth as needed. For cramps    . AMITRIPTYLINE HCL 10 MG PO TABS Oral Take 20 mg by mouth at bedtime.    Marland Kitchen CETIRIZINE HCL 10 MG PO TABS Oral Take 10 mg by mouth daily.    . MOMETASONE FUROATE 220 MCG/INH IN AEPB Inhalation Inhale 1 puff into the lungs 2 (two) times daily.    Marland Kitchen PENTOSAN POLYSULFATE SODIUM 100 MG PO CAPS Oral Take 100 mg by mouth 3 (three) times daily before meals.      BP 122/54  Pulse 97  Temp(Src) 98.1 F (36.7 C) (Oral)  Resp 21  SpO2 99%  LMP 08/07/2011  Physical Exam  Nursing note and vitals reviewed. Constitutional: She is oriented to person, place, and time. She appears well-developed and well-nourished.       Uncomfortable appearing.  HENT:  Head: Normocephalic and atraumatic.  Neck: Normal range of motion. Neck supple.  Cardiovascular: Normal rate, regular rhythm and normal heart sounds.   Pulmonary/Chest: Effort normal and  breath sounds normal. No respiratory distress.  Abdominal: Normal appearance and bowel sounds are normal. She exhibits no mass. There is tenderness in the right lower quadrant and suprapubic area. There is CVA tenderness and tenderness at McBurney's point. There is no rigidity, no rebound, no guarding and negative Murphy's sign.       Right CVA tenderness  Neurological: She is alert and oriented to person, place, and time.  Skin: Skin is warm and dry. No rash noted.  Psychiatric: She has a normal mood and affect.    ED Course  Procedures (including critical care time)   Labs Reviewed  URINALYSIS, ROUTINE W REFLEX MICROSCOPIC  PREGNANCY, URINE  CBC    DIFFERENTIAL  COMPREHENSIVE METABOLIC PANEL   No results found.   No diagnosis found.  7:15 AM Reassessed patient.  Patient reports that she is still having pain in her right lower back.  Additional pain medication has been ordered, but patient has not received it yet.  Urine pregnancy is negative.  Therefore, CT abdomen/pelvis has been ordered to evaluate for possible kidney stone. 8:01 AM Reassessed patient.  Patient reports that her pain is well controlled at this time.  She denies any nausea.  CT abdomen/pelvis shows a partially obstructive 4mm stone with mild hydroureter nephrosis and perinephritic stranding.  Awaiting results of CMP. 8:32 AM Pain well controlled.  Patient able to tolerate po liquids. 8:44 AM Patient reports that she started having sharp pain again.  Will order more pain medication.  MDM  Pt has been diagnosed with a Kidney Stone via CT. There is no evidence of significant hydronephrosis, serum creatine WNL, vitals sign stable and the pt does not have irratractable vomiting. Pt will be dc home with pain medications & has been advised to follow up with Urology.         Pascal [place], PA-C 08/10/11 1718

## 2011-08-10 NOTE — ED Notes (Signed)
Pt states she has irregular periods and is on her menstrual cycle.  Pt has pain 10/10 in L flank wrapping around to abdomen.  Pt is very distraught and upset.

## 2011-08-10 NOTE — ED Provider Notes (Signed)
Medical screening examination/treatment/procedure(s) were conducted as a shared visit with non-physician practitioner(s) and myself.  I personally evaluated the patient during the encounter  R flank/RLQ pain. Appears to be in severe pain/discomfort. Min RLQ ttp. CT with nephrolithiasis. Pain control, tolerating PO, home. H/O Interstitial cystitis. UA contaminated. No c/o UTI sx. F/U with urology  Forbes Cellar, MD 08/10/11 2314

## 2011-08-10 NOTE — ED Notes (Signed)
PT. REPORTS RIGHT LOWER BACK PAIN /RIGHT FLANK PAIN / RLQ PAIN ONSET THIS MORNING WITH NAUSEA , DENIES VOMITTING OR DIARRHEA.

## 2011-08-10 NOTE — ED Notes (Signed)
Assumed patient's care, pt A/A/Ox4, skin warm and dry, respiration even and unlabored. Pt denies any pain at present.

## 2011-08-10 NOTE — ED Notes (Signed)
Pt refused labs, was too upset from pain to be stuck per patient statement.

## 2012-01-10 ENCOUNTER — Inpatient Hospital Stay (HOSPITAL_COMMUNITY)
Admission: AD | Admit: 2012-01-10 | Discharge: 2012-01-10 | Discharge status: Against Medical Advice | Payer: 59 | Source: Ambulatory Visit | Attending: Obstetrics and Gynecology | Admitting: Obstetrics and Gynecology

## 2012-01-10 NOTE — MAU Note (Signed)
Pt reports she had an IUD placed on 12/30/2011, and was ok for first 3 days , but has had pain since then, using meds off/on with minimal relief. IUD placed due to dysfunctional uterine bleeding. Still spotting. Pt reports pain is a little better now

## 2012-05-12 ENCOUNTER — Other Ambulatory Visit: Payer: Self-pay | Admitting: Internal Medicine

## 2012-05-12 DIAGNOSIS — Z1231 Encounter for screening mammogram for malignant neoplasm of breast: Secondary | ICD-10-CM

## 2012-05-25 ENCOUNTER — Emergency Department (HOSPITAL_COMMUNITY)
Admission: EM | Admit: 2012-05-25 | Discharge: 2012-05-25 | Disposition: A | Discharge status: Home / Self Care | Payer: 59 | Source: Home / Self Care | Attending: Family Medicine | Admitting: Family Medicine

## 2012-05-25 ENCOUNTER — Encounter (HOSPITAL_COMMUNITY): Payer: Self-pay | Admitting: *Deleted

## 2012-05-25 DIAGNOSIS — M7918 Myalgia, other site: Secondary | ICD-10-CM

## 2012-05-25 DIAGNOSIS — M25519 Pain in unspecified shoulder: Secondary | ICD-10-CM

## 2012-05-25 MED ORDER — IBUPROFEN 600 MG PO TABS: 600.0000 mg | ORAL_TABLET | Freq: Three times a day (TID) | ORAL | Status: DC

## 2012-05-25 MED ORDER — TRAMADOL HCL 50 MG PO TABS: 50.0000 mg | ORAL_TABLET | Freq: Four times a day (QID) | ORAL | Status: DC | PRN

## 2012-05-25 NOTE — Discharge Instructions (Signed)
My impression is that the muscle soreness your experiencing is a side effect related to your recent flu vaccine in the affected area . It should be self-limited.  Can continue to use ice pad alternate with hot pads. Take the prescribed dictations as instructed. Be aware that tramadol can make you drowsy and he should not drive after taking this medication. Followup your primary care provider if persistent or worsening symptoms despite following treatment.

## 2012-05-25 NOTE — ED Notes (Signed)
Patient awaiting md assessment

## 2012-05-25 NOTE — ED Notes (Signed)
Pt reports left arm pain with no known injury states that pain has gotten increasingly worse - denies chest pain, nausea, diarrhea, pain in back or radiating towards neck. Pain does come and go and is helped by otc pain relief

## 2012-05-25 NOTE — ED Provider Notes (Signed)
History     CSN: [id number]  Arrival date & time 05/25/12  1140   First MD Initiated Contact with Patient 05/25/12 1151      Chief Complaint  Patient presents with  . Arm Pain    (Consider location/radiation/quality/duration/timing/severity/associated sxs/prior treatment) HPI Comments: 46 year old female with history of asthma and mood disorder. Here complaining of left upper arm pain for about a week. Patient reports that she had her flu shot administered in the tender area over a week ago. Denies any redness or obvious swelling. Area is tender to touch and pain is worse with elevated and her left arm separated from her body. Worse if heavy lifting or repetitive arm elevation movements. Reports the pain radiates downwards but does not involve the elbow joint. Denies any numbness, weakness or tingling in her upper extremities. Denies fever or chills. Denies general malaise, headache or any other symptoms. Patient has taken Midol with transient improvement of her symptoms.   Past Medical History  Diagnosis Date  . Interstitial cystitis   . Asthma   . Seasonal allergies   . Depression     Past Surgical History  Procedure Date  . Cesarean section   . Cystitis   . Foot surgery     History reviewed. No pertinent family history.  History  Substance Use Topics  . Smoking status: Never Smoker   . Smokeless tobacco: Not on file  . Alcohol Use: Yes    OB History    Grav Para Term Preterm Abortions TAB SAB Ect Mult Living                  Review of Systems  Constitutional: Negative for fever and chills.  Musculoskeletal: Negative for joint swelling and arthralgias.       As per HPI  Skin: Negative for rash.  Neurological: Negative for weakness and numbness.  All other systems reviewed and are negative.    Allergies  Latex  Home Medications   Current Outpatient Rx  Name  Route  Sig  Dispense  Refill  . MIDOL COMPLETE PO   Oral   Take 2 tablets by mouth as  needed. For cramps         . AMITRIPTYLINE HCL 10 MG PO TABS   Oral   Take 20 mg by mouth at bedtime.         Marland Kitchen CETIRIZINE HCL 10 MG PO TABS   Oral   Take 10 mg by mouth daily.         . MOMETASONE FUROATE 220 MCG/INH IN AEPB   Inhalation   Inhale 1 puff into the lungs 2 (two) times daily.         Marland Kitchen PENTOSAN POLYSULFATE SODIUM 100 MG PO CAPS   Oral   Take 100 mg by mouth 3 (three) times daily before meals.         . IBUPROFEN 600 MG PO TABS   Oral   Take 1 tablet (600 mg total) by mouth 3 (three) times daily.   30 tablet   0     Take with food   . TRAMADOL HCL 50 MG PO TABS   Oral   Take 1 tablet (50 mg total) by mouth every 6 (six) hours as needed for pain.   15 tablet   0     BP 116/82  Pulse 89  Temp 98.1 F (36.7 C) (Oral)  Resp 20  SpO2 98%  LMP 05/24/2012  Physical Exam  Nursing  note and vitals reviewed. Constitutional: She is oriented to person, place, and time. She appears well-developed and well-nourished. No distress.  Eyes: No scleral icterus.  Neck: Neck supple. No thyromegaly present.  Cardiovascular: Normal heart sounds.   Pulmonary/Chest: Breath sounds normal.  Musculoskeletal:       Left arm: tenderness to palpation and increased muscle tone over left deltoid area. No associated increased temperature, erythema or skin swelling. Pain worse with active arm abduction. No significant discomfort with passive arm movements. Left arm grossly neurovascularly intact.  Lymphadenopathy:    She has no cervical adenopathy.  Neurological: She is alert and oriented to person, place, and time.  Skin: No rash noted.    ED Course  Procedures (including critical care time)  Labs Reviewed - No data to display No results found.   1. 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 No erythema, increased temperature or swelling observed. Prescribed alternating heat  and cold pads, ibuprofen and tramadol. Supportive care and red flags that should prompt her return to medical attention discussed with patient and provided in writing.        Sharin Grave, MD 05/26/12 1914

## 2012-06-10 ENCOUNTER — Emergency Department (HOSPITAL_COMMUNITY)
Admission: EM | Admit: 2012-06-10 | Discharge: 2012-06-10 | Disposition: A | Discharge status: Home / Self Care | Payer: 59 | Source: Home / Self Care | Attending: Emergency Medicine | Admitting: Emergency Medicine

## 2012-06-10 ENCOUNTER — Encounter (HOSPITAL_COMMUNITY): Payer: Self-pay | Admitting: *Deleted

## 2012-06-10 DIAGNOSIS — N200 Calculus of kidney: Secondary | ICD-10-CM

## 2012-06-10 LAB — POCT URINALYSIS DIP (DEVICE)
Bilirubin Urine: NEGATIVE
Glucose, UA: NEGATIVE mg/dL
Ketones, ur: NEGATIVE mg/dL
Leukocytes, UA: NEGATIVE
Nitrite: NEGATIVE
Protein, ur: NEGATIVE mg/dL
Specific Gravity, Urine: >=1.03 (ref 1.005–1.030)
Urobilinogen, UA: 0.2 mg/dL (ref 0.0–1.0)
pH: 5.5 (ref 5.0–8.0)

## 2012-06-10 LAB — POCT PREGNANCY, URINE: Preg Test, Ur: NEGATIVE

## 2012-06-10 MED ORDER — HYDROMORPHONE HCL PF 1 MG/ML IJ SOLN
INTRAMUSCULAR | Status: AC
  Filled 2012-06-10: qty 2

## 2012-06-10 MED ORDER — KETOROLAC TROMETHAMINE 10 MG PO TABS: 10.0000 mg | ORAL_TABLET | Freq: Four times a day (QID) | ORAL | Status: DC | PRN

## 2012-06-10 MED ORDER — ONDANSETRON 4 MG PO TBDP
ORAL_TABLET | ORAL | Status: AC
  Filled 2012-06-10: qty 2

## 2012-06-10 MED ORDER — TAMSULOSIN HCL 0.4 MG PO CAPS: 0.4000 mg | ORAL_CAPSULE | Freq: Every day | ORAL | Status: DC

## 2012-06-10 MED ORDER — ONDANSETRON 4 MG PO TBDP
8.0000 mg | ORAL_TABLET | Freq: Once | ORAL | Status: AC
  Administered 2012-06-10: 8 mg via ORAL

## 2012-06-10 MED ORDER — KETOROLAC TROMETHAMINE 30 MG/ML IJ SOLN
INTRAMUSCULAR | Status: AC
  Filled 2012-06-10: qty 1

## 2012-06-10 MED ORDER — OXYCODONE-ACETAMINOPHEN 5-325 MG PO TABS: ORAL_TABLET | ORAL | Status: DC

## 2012-06-10 MED ORDER — KETOROLAC TROMETHAMINE 60 MG/2ML IM SOLN
30.0000 mg | Freq: Once | INTRAMUSCULAR | Status: AC
  Administered 2012-06-10: 30 mg via INTRAMUSCULAR

## 2012-06-10 MED ORDER — HYDROMORPHONE HCL PF 1 MG/ML IJ SOLN
2.0000 mg | Freq: Once | INTRAMUSCULAR | Status: AC
Start: 2012-06-10 — End: 2012-06-10
  Administered 2012-06-10: 2 mg via INTRAMUSCULAR

## 2012-06-10 NOTE — ED Notes (Signed)
Pt   Reports       l  Flank  Pain  Nausea             No  Vomiting   No     Diarrhea             Pt  Ambulated   With a   Slow  Steady  Gait          Pt  Reports  Has  Had        Kidney  Stone      In      Past      Pt  Takes          vicodin  /         And            flomax

## 2012-06-10 NOTE — ED Provider Notes (Signed)
Chief Complaint  Patient presents with  . Flank Pain    History of Present Illness:   The patient is a 46 year old female who has had a history of sudden onset yesterday around 4 5 PM of severe left flank pain rated 9/10 in intensity. It's been accompanied by some nausea but no vomiting. She's had no fever, chills, dysuria, frequency, urgency, hematuria, or GYN complaints. She denies any anterior abdominal pain and the pain has not shifted since it first began. The patient has had 3 kidney stones in the past and this is what it feels like to her. Her last one was in Jan 25, 2023. All these have passed on their own. She is seeing Dr. Patsi Sears for these. She has interstitial cystitis, asthma, and gastroesophageal reflux disease. She has taken Flomax in the past to facilitate stone passage.  Review of Systems:  Other than noted above, the patient denies any of the following symptoms: General:  No fevers, chills, sweats, aches, or fatigue. GI:  No abdominal pain, back pain, nausea, vomiting, diarrhea, or constipation. GU:  No dysuria, frequency, urgency, hematuria, or incontinence. GYN:  No discharge, itching, vulvar pain or lesions, pelvic pain, or abnormal vaginal bleeding.  PMFSH:  Past medical history, family history, social history, meds, and allergies were reviewed.  Physical Exam:   Vital signs:  BP 120/77  Pulse 86  Temp 98.2 F (36.8 C) (Oral)  Resp 20  SpO2 97%  LMP 04/25/2012 Gen:  Alert, oriented, in no distress. Lungs:  Clear to auscultation, no wheezes, rales or rhonchi. Heart:  Regular rhythm, no gallop or murmer. Abdomen:  Flat and soft. There was slight suprapubic pain to palpation.  No guarding, or rebound.  No hepato-splenomegaly or mass.  Bowel sounds were normally active.  No hernia. Back:  No CVA tenderness.  Skin:  Clear, warm and dry.  Labs:   Results for orders placed during the hospital encounter of 06/10/12  POCT PREGNANCY, URINE      Component Value Range   Preg  Test, Ur NEGATIVE  NEGATIVE  POCT URINALYSIS DIP (DEVICE)      Component Value Range   Glucose, UA NEGATIVE  NEGATIVE mg/dL   Bilirubin Urine NEGATIVE  NEGATIVE   Ketones, ur NEGATIVE  NEGATIVE mg/dL   Specific Gravity, Urine >=1.030  1.005 - 1.030   Hgb urine dipstick MODERATE (*) NEGATIVE   pH 5.5  5.0 - 8.0   Protein, ur NEGATIVE  NEGATIVE mg/dL   Urobilinogen, UA 0.2  0.0 - 1.0 mg/dL   Nitrite NEGATIVE  NEGATIVE   Leukocytes, UA NEGATIVE  NEGATIVE     Other Labs Obtained at Urgent Care Center:  A urine culture was obtained.  Results are pending at this time and we will call about any positive results.  Course in Urgent Care Center:   She was given Toradol 30 mg IM, Dilaudid 2 mg IM, and Zofran 8 mg by mouth and tolerated these well without any immediate side effects.  Assessment: The encounter diagnosis was Kidney stone.   Plan:   1.  The following meds were prescribed:   New Prescriptions   KETOROLAC (TORADOL) 10 MG TABLET    Take 1 tablet (10 mg total) by mouth every 6 (six) hours as needed for pain.   OXYCODONE-ACETAMINOPHEN (PERCOCET) 5-325 MG PER TABLET    1 to 2 tablets every 6 hours as needed for pain.   TAMSULOSIN HCL (FLOMAX) 0.4 MG CAPS    Take 1 capsule (0.4 mg total)  by mouth daily.   2.  The patient was instructed in symptomatic care and handouts were given. She was told to get extra fluids and given some instructions about a low oxalate diet. I suggested that if she had not passed the stone by Monday to call Dr. Patsi Sears. I also suggested that she started running a fever or had severe vomiting such that she couldn't keep the medicines down to return again as soon as possible. 3.  The patient was told to return if becoming worse in any way, if no better in 3 or 4 days, and given some red flag symptoms that would indicate earlier return.   Reuben Likes, MD 06/10/12 [phone]

## 2012-06-11 LAB — URINE CULTURE

## 2012-06-12 NOTE — ED Notes (Addendum)
Final report shows multiple species; no additional action required. Patient was advised to f/u with [facility] if problems

## 2012-06-23 ENCOUNTER — Ambulatory Visit
Admission: RE | Admit: 2012-06-23 | Discharge: 2012-06-23 | Disposition: A | Discharge status: Home / Self Care | Payer: 59 | Source: Ambulatory Visit | Attending: Internal Medicine | Admitting: Internal Medicine

## 2012-06-23 DIAGNOSIS — Z1231 Encounter for screening mammogram for malignant neoplasm of breast: Secondary | ICD-10-CM

## 2012-07-07 ENCOUNTER — Encounter (HOSPITAL_COMMUNITY): Payer: Self-pay

## 2012-07-07 ENCOUNTER — Emergency Department (HOSPITAL_COMMUNITY)
Admission: EM | Admit: 2012-07-07 | Discharge: 2012-07-07 | Disposition: A | Discharge status: Home / Self Care | Payer: 59 | Source: Home / Self Care | Attending: Family Medicine | Admitting: Family Medicine

## 2012-07-07 DIAGNOSIS — J111 Influenza due to unidentified influenza virus with other respiratory manifestations: Secondary | ICD-10-CM

## 2012-07-07 NOTE — ED Notes (Signed)
Cough, fever, chills since 12-25

## 2012-07-07 NOTE — ED Provider Notes (Signed)
History     CSN: [id number]  Arrival date & time 07/07/12  1052   First MD Initiated Contact with Patient 07/07/12 1153      Chief Complaint  Patient presents with  . Fever    (Consider location/radiation/quality/duration/timing/severity/associated sxs/prior treatment) Patient is a 46 y.o. female presenting with fever. The history is provided by the patient and the spouse.  Fever Primary symptoms of the febrile illness include fever, cough and myalgias. Primary symptoms do not include wheezing, abdominal pain, nausea, vomiting, diarrhea or rash. The current episode started 3 to 5 days ago. This is a new problem. The problem has been gradually improving.  Associated with: sick family members.    Past Medical History  Diagnosis Date  . Interstitial cystitis   . Asthma   . Seasonal allergies   . Depression     Past Surgical History  Procedure Date  . Cesarean section   . Cystitis   . Foot surgery     History reviewed. No pertinent family history.  History  Substance Use Topics  . Smoking status: Never Smoker   . Smokeless tobacco: Not on file  . Alcohol Use: Yes    OB History    Grav Para Term Preterm Abortions TAB SAB Ect Mult Living                  Review of Systems  Constitutional: Positive for fever, chills and appetite change.  HENT: Negative.   Respiratory: Positive for cough. Negative for wheezing.   Cardiovascular: Negative.   Gastrointestinal: Negative.  Negative for nausea, vomiting, abdominal pain and diarrhea.  Genitourinary: Negative.   Musculoskeletal: Positive for myalgias.  Skin: Negative for rash.    Allergies  Latex  Home Medications   Current Outpatient Rx  Name  Route  Sig  Dispense  Refill  . AMITRIPTYLINE HCL 10 MG PO TABS   Oral   Take 20 mg by mouth at bedtime.         Marland Kitchen CETIRIZINE HCL 10 MG PO TABS   Oral   Take 10 mg by mouth daily.         . IBUPROFEN 600 MG PO TABS   Oral   Take 1 tablet (600 mg total) by  mouth 3 (three) times daily.   30 tablet   0     Take with food   . MOMETASONE FUROATE 220 MCG/INH IN AEPB   Inhalation   Inhale 1 puff into the lungs 2 (two) times daily.         Marland Kitchen PENTOSAN POLYSULFATE SODIUM 100 MG PO CAPS   Oral   Take 100 mg by mouth 3 (three) times daily before meals.         Marland Kitchen MIDOL COMPLETE PO   Oral   Take 2 tablets by mouth as needed. For cramps         . KETOROLAC TROMETHAMINE 10 MG PO TABS   Oral   Take 1 tablet (10 mg total) by mouth every 6 (six) hours as needed for pain.   20 tablet   0   . OXYCODONE-ACETAMINOPHEN 5-325 MG PO TABS      1 to 2 tablets every 6 hours as needed for pain.   20 tablet   0   . TAMSULOSIN HCL 0.4 MG PO CAPS   Oral   Take 1 capsule (0.4 mg total) by mouth daily.   7 capsule   0   . TRAMADOL HCL 50 MG  PO TABS   Oral   Take 1 tablet (50 mg total) by mouth every 6 (six) hours as needed for pain.   15 tablet   0     BP 113/78  Pulse 88  Temp 98.9 F (37.2 C) (Oral)  Resp 18  SpO2 98%  LMP 06/21/2012  Physical Exam  Nursing note and vitals reviewed. Constitutional: She is oriented to person, place, and time. She appears well-developed and well-nourished.  HENT:  Head: Normocephalic.  Right Ear: External ear normal.  Left Ear: External ear normal.  Mouth/Throat: Oropharynx is clear and moist.  Eyes: Conjunctivae normal and EOM are normal. Pupils are equal, round, and reactive to light.  Neck: Normal range of motion. Neck supple.  Cardiovascular: Normal rate, normal heart sounds and intact distal pulses.   Pulmonary/Chest: Effort normal and breath sounds normal.  Abdominal: Soft. Bowel sounds are normal.  Lymphadenopathy:    She has no cervical adenopathy.  Neurological: She is alert and oriented to person, place, and time.  Skin: Skin is warm and dry.    ED Course  Procedures (including critical care time)  Labs Reviewed - No data to display No results found.   1. Influenza-like  illness       MDM          Linna Hoff, MD 07/07/12 1224

## 2012-08-26 ENCOUNTER — Encounter (HOSPITAL_COMMUNITY): Payer: Self-pay

## 2012-08-26 ENCOUNTER — Emergency Department (HOSPITAL_COMMUNITY)
Admission: EM | Admit: 2012-08-26 | Discharge: 2012-08-26 | Disposition: A | Discharge status: Home / Self Care | Payer: 59 | Source: Home / Self Care | Attending: Emergency Medicine | Admitting: Emergency Medicine

## 2012-08-26 DIAGNOSIS — R0981 Nasal congestion: Secondary | ICD-10-CM

## 2012-08-26 DIAGNOSIS — H6983 Other specified disorders of Eustachian tube, bilateral: Secondary | ICD-10-CM

## 2012-08-26 DIAGNOSIS — J3489 Other specified disorders of nose and nasal sinuses: Secondary | ICD-10-CM

## 2012-08-26 DIAGNOSIS — J40 Bronchitis, not specified as acute or chronic: Secondary | ICD-10-CM

## 2012-08-26 DIAGNOSIS — H6993 Unspecified Eustachian tube disorder, bilateral: Secondary | ICD-10-CM

## 2012-08-26 DIAGNOSIS — H6980 Other specified disorders of Eustachian tube, unspecified ear: Secondary | ICD-10-CM

## 2012-08-26 MED ORDER — HYDROCOD POLST-CHLORPHEN POLST 10-8 MG/5ML PO LQCR: 5.0000 mL | Freq: Two times a day (BID) | ORAL | Status: DC | PRN

## 2012-08-26 MED ORDER — AZITHROMYCIN 250 MG PO TABS: ORAL_TABLET | ORAL | Status: DC

## 2012-08-26 NOTE — ED Provider Notes (Signed)
Medical screening examination/treatment/procedure(s) were performed by non-physician practitioner and as supervising physician I was immediately available for consultation/collaboration.  Deaisha Welborn   Liller Yohn, MD 08/26/12 1542 

## 2012-08-26 NOTE — ED Notes (Signed)
Reports cough for 1 week; concerned her reflux is worse (doubled her prevacid) NAD, syx worse when lays down

## 2012-08-26 NOTE — ED Provider Notes (Signed)
History     CSN: [id number]  Arrival date & time 08/26/12  1158   First MD Initiated Contact with Patient 08/26/12 1307      Chief Complaint  Patient presents with  . Cough    HPI: Patient is a 47 y.o. female presenting with cough. The history is provided by the patient.  Cough Cough characteristics:  Productive and nocturnal Sputum characteristics:  White Severity:  Moderate Onset quality:  Gradual Duration:  6 days Timing:  Intermittent Progression:  Worsening Chronicity:  New Smoker: no   Context: upper respiratory infection   Context: not animal exposure, not exposure to allergens, not fumes, not occupational exposure, not sick contacts, not smoke exposure, not weather changes and not with activity   Relieved by:  Nothing Worsened by:  Deep breathing and lying down Ineffective treatments:  Beta-agonist inhaler Associated symptoms: chills, sinus congestion and sore throat   Associated symptoms: no chest pain, no ear pain, no fever, no myalgias, no rash, no rhinorrhea, no shortness of breath and no wheezing   Pt reports a 6 day h/o persistent cough. Cough is productive at times of white secretions that at times are blood tinged. The cough is worse at night making it difficult to sleep. Pt thought it may be related to her reflux so she increased the dose of her Prilosec but this did not help. She has h/o asthma an has been using her inhaler with minimal relief. She has had a mild sore throat which has nearly resolved. She reports throat irritation that is associated with pressure and a tickling sensation in her ears. Also admits to some mild sinus congestion.   Past Medical History  Diagnosis Date  . Interstitial cystitis   . Asthma   . Seasonal allergies   . Depression     Past Surgical History  Procedure Laterality Date  . Cesarean section    . Cystitis    . Foot surgery      No family history on file.  History  Substance Use Topics  . Smoking status: Never  Smoker   . Smokeless tobacco: Not on file  . Alcohol Use: Yes    OB History   Grav Para Term Preterm Abortions TAB SAB Ect Mult Living                  Review of Systems  Constitutional: Positive for chills. Negative for fever.  HENT: Positive for congestion, sore throat, postnasal drip and sinus pressure. Negative for ear pain, facial swelling, rhinorrhea, sneezing, trouble swallowing, neck pain, neck stiffness and voice change.   Eyes: Negative.   Respiratory: Positive for cough. Negative for chest tightness, shortness of breath and wheezing.   Cardiovascular: Negative for chest pain.  Gastrointestinal: Negative for nausea, vomiting, abdominal pain and diarrhea.  Endocrine: Negative.   Genitourinary: Negative.   Musculoskeletal: Negative.  Negative for myalgias.  Skin: Negative.  Negative for rash.  Allergic/Immunologic: Negative.   Neurological: Negative.   Hematological: Negative.   Psychiatric/Behavioral: Negative.     Allergies  Latex  Home Medications   Current Outpatient Rx  Name  Route  Sig  Dispense  Refill  . Acetaminophen-Caff-Pyrilamine (MIDOL COMPLETE PO)   Oral   Take 2 tablets by mouth as needed. For cramps         . amitriptyline (ELAVIL) 10 MG tablet   Oral   Take 20 mg by mouth at bedtime.         . cetirizine (ZYRTEC) 10  MG tablet   Oral   Take 10 mg by mouth daily.         Marland Kitchen ibuprofen (ADVIL,MOTRIN) 600 MG tablet   Oral   Take 1 tablet (600 mg total) by mouth 3 (three) times daily.   30 tablet   0     Take with food   . ketorolac (TORADOL) 10 MG tablet   Oral   Take 1 tablet (10 mg total) by mouth every 6 (six) hours as needed for pain.   20 tablet   0   . mometasone (ASMANEX) 220 MCG/INH inhaler   Inhalation   Inhale 1 puff into the lungs 2 (two) times daily.         Marland Kitchen oxyCODONE-acetaminophen (PERCOCET) 5-325 MG per tablet      1 to 2 tablets every 6 hours as needed for pain.   20 tablet   0   . pentosan polysulfate  (ELMIRON) 100 MG capsule   Oral   Take 100 mg by mouth 3 (three) times daily before meals.         . Tamsulosin HCl (FLOMAX) 0.4 MG CAPS   Oral   Take 1 capsule (0.4 mg total) by mouth daily.   7 capsule   0   . traMADol (ULTRAM) 50 MG tablet   Oral   Take 1 tablet (50 mg total) by mouth every 6 (six) hours as needed for pain.   15 tablet   0     BP 136/91  Pulse 67  Temp(Src) 98.9 F (37.2 C) (Oral)  Resp 17  SpO2 97%  Physical Exam  Constitutional: She is oriented to person, place, and time. She appears well-developed and well-nourished.  HENT:  Head: Normocephalic and atraumatic.  Right Ear: External ear normal.  Left Ear: External ear normal.  Nose: Mucosal edema present. No sinus tenderness. Right sinus exhibits no maxillary sinus tenderness and no frontal sinus tenderness. Left sinus exhibits no maxillary sinus tenderness and no frontal sinus tenderness.  Mouth/Throat: Uvula is midline, oropharynx is clear and moist and mucous membranes are normal.  Cobblestoning to oropharynx c/w PND   Neck: Neck supple.  Cardiovascular: Normal rate and regular rhythm.   Pulmonary/Chest: Effort normal and breath sounds normal.  Frequent cough  Musculoskeletal: Normal range of motion.  Neurological: She is alert and oriented to person, place, and time.  Skin: Skin is warm and dry.  Psychiatric: She has a normal mood and affect.    ED Course  Procedures (including critical care time)  Labs Reviewed - No data to display No results found.   No diagnosis found.    MDM  Persistent cough x 6 days that is intermittently productive and nocturnal. Pressure/tickling sensation in bi lears likely related to mild eustachian tube dysfuction related to sinus congestion. PE not c/w infectious ear process. No fever. BBS CTA. Non-toxic in appearance. Will treat for Bronchitis since h/o asthma. Will encourage use of OTC antihistamines for sinus and bil ear symptoms and provide short  course of medication for cough at night and encourage f/u w/ PCP if not improving.        Leanne Chang, NP 08/26/12 1445  Roma Kayser Li Bobo, NP 08/26/12 1454

## 2013-05-24 ENCOUNTER — Other Ambulatory Visit: Payer: Self-pay

## 2013-05-24 DIAGNOSIS — Z1231 Encounter for screening mammogram for malignant neoplasm of breast: Secondary | ICD-10-CM

## 2013-06-25 ENCOUNTER — Ambulatory Visit: Payer: 59

## 2013-06-29 ENCOUNTER — Ambulatory Visit: Payer: 59

## 2013-07-02 ENCOUNTER — Ambulatory Visit: Payer: 59

## 2013-07-02 ENCOUNTER — Ambulatory Visit
Admission: RE | Admit: 2013-07-02 | Discharge: 2013-07-02 | Disposition: A | Discharge status: Home / Self Care | Payer: 59 | Source: Ambulatory Visit

## 2013-07-02 DIAGNOSIS — Z1231 Encounter for screening mammogram for malignant neoplasm of breast: Secondary | ICD-10-CM

## 2014-06-24 ENCOUNTER — Other Ambulatory Visit: Payer: Self-pay

## 2014-06-24 DIAGNOSIS — Z1231 Encounter for screening mammogram for malignant neoplasm of breast: Secondary | ICD-10-CM

## 2014-07-09 ENCOUNTER — Encounter (INDEPENDENT_AMBULATORY_CARE_PROVIDER_SITE_OTHER): Payer: Self-pay

## 2014-07-09 ENCOUNTER — Ambulatory Visit
Admission: RE | Admit: 2014-07-09 | Discharge: 2014-07-09 | Disposition: A | Discharge status: Home / Self Care | Payer: 59 | Source: Ambulatory Visit

## 2014-07-09 DIAGNOSIS — Z1231 Encounter for screening mammogram for malignant neoplasm of breast: Secondary | ICD-10-CM

## 2015-04-14 ENCOUNTER — Other Ambulatory Visit: Payer: Self-pay | Admitting: *Deleted

## 2015-04-14 DIAGNOSIS — J4530 Mild persistent asthma, uncomplicated: Secondary | ICD-10-CM

## 2015-04-14 MED ORDER — MOMETASONE FUROATE 220 MCG/INH IN AEPB: 2.0000 | INHALATION_SPRAY | Freq: Two times a day (BID) | RESPIRATORY_TRACT | Status: DC

## 2015-06-18 ENCOUNTER — Other Ambulatory Visit: Payer: Self-pay | Admitting: Allergy and Immunology

## 2015-06-26 ENCOUNTER — Other Ambulatory Visit: Payer: Self-pay

## 2015-06-26 DIAGNOSIS — Z1231 Encounter for screening mammogram for malignant neoplasm of breast: Secondary | ICD-10-CM

## 2015-07-10 ENCOUNTER — Other Ambulatory Visit: Payer: Self-pay | Admitting: Allergy and Immunology

## 2015-07-16 ENCOUNTER — Ambulatory Visit: Payer: Self-pay

## 2015-08-11 ENCOUNTER — Other Ambulatory Visit: Payer: Self-pay | Admitting: Allergy and Immunology

## 2015-08-18 ENCOUNTER — Other Ambulatory Visit: Payer: Self-pay | Admitting: Allergy and Immunology

## 2015-09-30 ENCOUNTER — Other Ambulatory Visit: Payer: Self-pay | Admitting: Allergy and Immunology

## 2015-10-16 ENCOUNTER — Encounter: Payer: Self-pay | Admitting: Allergy and Immunology

## 2015-10-16 ENCOUNTER — Ambulatory Visit (INDEPENDENT_AMBULATORY_CARE_PROVIDER_SITE_OTHER): Payer: 59 | Admitting: Allergy and Immunology

## 2015-10-16 VITALS — BP 110/70 | HR 74 | Temp 98.4°F | Resp 16

## 2015-10-16 DIAGNOSIS — H1010 Acute atopic conjunctivitis, unspecified eye: Secondary | ICD-10-CM | POA: Diagnosis not present

## 2015-10-16 DIAGNOSIS — J4530 Mild persistent asthma, uncomplicated: Secondary | ICD-10-CM

## 2015-10-16 DIAGNOSIS — J309 Allergic rhinitis, unspecified: Secondary | ICD-10-CM | POA: Diagnosis not present

## 2015-10-16 MED ORDER — MONTELUKAST SODIUM 10 MG PO TABS: ORAL_TABLET | ORAL | Status: DC

## 2015-10-16 MED ORDER — CETIRIZINE HCL 10 MG PO TABS: ORAL_TABLET | ORAL | Status: DC

## 2015-10-16 MED ORDER — MOMETASONE FUROATE 50 MCG/ACT NA SUSP: NASAL | Status: DC

## 2015-10-16 MED ORDER — ALBUTEROL SULFATE HFA 108 (90 BASE) MCG/ACT IN AERS: INHALATION_SPRAY | RESPIRATORY_TRACT | Status: DC

## 2015-10-16 NOTE — Patient Instructions (Addendum)
   Restart Singulair 10 mg each evening.  Continue Zyrtec and Nasonex.  Saline nasal wash each evening at shower time.  Ventolin HFA 2 puffs every 4 as needed for cough or wheeze, may use 5-15 minutes prior to exercise.  Call office with any recurring Ventolin use.  Follow-up in 6 months or sooner if needed.

## 2015-10-16 NOTE — Progress Notes (Signed)
FOLLOW UP NOTE  RE: Samantha Mcgee MRN: [id number] DOB: 04/26/1966 [facility] [place] [address] [place] [address] Date of Office Visit: 10/16/2015  Subjective:  Samantha Mcgee is a 50 y.o. female who presents today for Asthma; Medication Refill; and Medication Management  Assessment:   1. Mild persistent asthma, Intermittent exercise induced symptoms.   2. Allergic rhinoconjunctivitis    Plan:   Meds ordered this encounter  Medications  . cetirizine (ZYRTEC) 10 MG tablet    Sig: Take one daily for runny nose or itching.    Dispense:  30 tablet    Refill:  5  . mometasone (NASONEX) 50 MCG/ACT nasal spray    Sig: Use 1-2 sprays in each nostril 1-2 times daily for stuffy nose or drainage.    Dispense:  17 g    Refill:  3    Please dispense Brand name Nasonex  . albuterol (PROAIR HFA) 108 (90 Base) MCG/ACT inhaler    Sig: Use 2 puffs every 4 hours as needed for cough or wheeze.  May use 2 puffs 10-20 minutes prior to exercise.    Dispense:  1 Inhaler    Refill:  1  . montelukast (SINGULAIR) 10 MG tablet    Sig: Take one tablet each evening to prevent cough or wheeze.    Dispense:  30 tablet    Refill:  5   Patient Instructions  1.  Restart Singulair 10 mg each evening. 2.  Continue Zyrtec and Nasonex. 3.  Saline nasal wash each evening at shower time. 4.  Ventolin HFA 2 puffs every 4 as needed for cough or wheeze, may use 5-15 minutes prior to exercise. 5.  Call office with any recurring Ventolin use. 6.  Follow-up in 6 months or sooner if needed.  HPI: Samantha Mcgee returns to the office in follow-up of asthma and allergic rhinoconjunctivitis.  Since her last visit in March 2016, she describes generally doing well, especially when consistent with her maintenance medications.  She was having many months, symptom free and has been off Asmanex and Singulair for at least 6 months and did not note any recurring difficulty.  Recently,  she has noted evening time nasal congestion, intermittent postnasal drip, snoring without headache, fever, sore throat, discolored drainage, disrupted breathing, cough, wheeze or shortness of breath.  She feels that Singulair was beneficial during the spring season in the past.  As well, she has been more active,  now using albuterol a couple of times a week with exercise.  Denies ED or urgent care visits, prednisone or antibiotic courses. Reports sleep and activity are normal.  Samantha Mcgee has a current medication list which includes the following prescription(s): aczone, albuterol, amitriptyline, cetirizine, ketorolac, lansoprazole, linzess, mometasone, pentosan polysulfate, acetaminophen-caff-pyrilamine.  Drug Allergies: Allergies  Allergen Reactions  . Latex    Objective:   Filed Vitals:   10/16/15 1608  BP: 110/70  Pulse: 74  Temp: 98.4 F (36.9 C)  Resp: 16   SpO2 Readings from Last 1 Encounters:  10/16/15 97%   Physical Exam  Constitutional: She is well-developed, well-nourished, and in no distress.  HENT:  Head: Atraumatic.  Right Ear: Tympanic membrane and ear canal normal.  Left Ear: Tympanic membrane and ear canal normal.  Nose: Mucosal edema present. No rhinorrhea. No epistaxis.  Mouth/Throat: Oropharynx is clear and moist and mucous membranes are normal. No oropharyngeal exudate, posterior oropharyngeal edema or posterior oropharyngeal erythema.  Neck: Neck supple.  Cardiovascular: Normal  rate, S1 normal and S2 normal.   No murmur heard. Pulmonary/Chest: Effort normal. She has no wheezes. She has no rhonchi. She has no rales.  Lymphadenopathy:    She has no cervical adenopathy.   Diagnostics: Spirometry:  FVC 2.11--77%, FEV1 1.98--87%.    Samantha M. Ishmael Holter, MD  cc: Maximino Greenland, MD

## 2015-10-21 ENCOUNTER — Other Ambulatory Visit: Payer: Self-pay | Admitting: Allergy and Immunology

## 2015-10-23 ENCOUNTER — Other Ambulatory Visit: Payer: Self-pay | Admitting: Allergy and Immunology

## 2015-10-27 ENCOUNTER — Other Ambulatory Visit: Payer: Self-pay | Admitting: *Deleted

## 2015-11-21 ENCOUNTER — Telehealth: Payer: Self-pay | Admitting: Allergy and Immunology

## 2015-11-21 NOTE — Telephone Encounter (Signed)
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 She wants to know why the balances are so high.

## 2015-11-24 NOTE — Telephone Encounter (Signed)
Called pt & advised that copay was $50 on each but she only paid $45 and that the spiro went to the deductible on both

## 2016-01-28 ENCOUNTER — Other Ambulatory Visit: Payer: Self-pay | Admitting: Internal Medicine

## 2016-01-28 ENCOUNTER — Ambulatory Visit
Admission: RE | Admit: 2016-01-28 | Discharge: 2016-01-28 | Disposition: A | Discharge status: Home / Self Care | Payer: 59 | Source: Ambulatory Visit | Attending: Internal Medicine | Admitting: Internal Medicine

## 2016-01-28 DIAGNOSIS — M79605 Pain in left leg: Secondary | ICD-10-CM

## 2016-04-20 ENCOUNTER — Other Ambulatory Visit: Payer: Self-pay | Admitting: *Deleted

## 2016-04-20 MED ORDER — MONTELUKAST SODIUM 10 MG PO TABS: ORAL_TABLET | ORAL | 0 refills | Status: DC

## 2016-05-06 ENCOUNTER — Other Ambulatory Visit: Payer: Self-pay | Admitting: *Deleted

## 2016-05-06 MED ORDER — CETIRIZINE HCL 10 MG PO TABS: ORAL_TABLET | ORAL | 5 refills | Status: DC

## 2016-05-17 ENCOUNTER — Other Ambulatory Visit: Payer: Self-pay | Admitting: Allergy & Immunology

## 2016-05-17 NOTE — Telephone Encounter (Signed)
Patient needs office visit.  

## 2016-06-17 ENCOUNTER — Other Ambulatory Visit: Payer: Self-pay | Admitting: Allergy & Immunology

## 2016-06-18 NOTE — Telephone Encounter (Signed)
Called the patient and left message for her to call back in regards of a request for a refill. The patient needs to make an appointment.

## 2016-06-24 ENCOUNTER — Encounter (INDEPENDENT_AMBULATORY_CARE_PROVIDER_SITE_OTHER): Payer: Self-pay

## 2016-06-24 ENCOUNTER — Ambulatory Visit (INDEPENDENT_AMBULATORY_CARE_PROVIDER_SITE_OTHER): Payer: 59 | Admitting: Allergy & Immunology

## 2016-06-24 ENCOUNTER — Encounter: Payer: Self-pay | Admitting: Allergy & Immunology

## 2016-06-24 VITALS — BP 116/66 | HR 99 | Temp 98.8°F | Resp 18 | Ht 63.5 in | Wt 198.4 lb

## 2016-06-24 DIAGNOSIS — J4530 Mild persistent asthma, uncomplicated: Secondary | ICD-10-CM | POA: Diagnosis not present

## 2016-06-24 DIAGNOSIS — J3089 Other allergic rhinitis: Secondary | ICD-10-CM

## 2016-06-24 MED ORDER — MONTELUKAST SODIUM 10 MG PO TABS: ORAL_TABLET | ORAL | 5 refills | Status: DC

## 2016-06-24 NOTE — Progress Notes (Signed)
FOLLOW UP  Date of Service/Encounter:  06/24/16   Assessment:   Mild persistent asthma, uncomplicated  Perennial allergic rhinitis   Asthma Reportables:  Severity: mild persistent  Risk: low Control: well controlled  Seasonal Influenza Vaccine: yes     Plan/Recommendations:   1. Mild persistent asthma, uncomplicated - Lung function looked stable today. - Continue with Singulair 10mg  daily. - Use ProAir 4 puffs every 4-6 hours as needed for coughing/wheezing.  2. Perennial allergic rhinitis - Continue with cetirizine 10mg  daily.  - Continue with Nasonex 2 sprays per nostril daily. - Discussed obtaining OTC medications for cheaper prices.  3. Return in about 6 months (around 12/23/2016).    Subjective:   Samantha Mcgee is a 50 y.o. female presenting today for follow up of  Chief Complaint  Patient presents with  . Follow-up    asthma refills  .  Samantha Mcgee has a history of the following: There are no active problems to display for this patient.   History obtained from: chart review and patient.  Samantha Mcgee was referred by Maximino Greenland, MD.     Samantha Mcgee is a 50 y.o. female presenting for a follow up visit. Ms. Fogelson was last seen in April 2017 by Dr. Ishmael Holter, who has since left the practice. At that time, Dr. Ishmael Holter recommended restarting the Singulair 10mg  daily. She was continued on cetirizine and Nasonex as well as nasal saline lavages. She does have intermittent asthma and was continued on Ventolin two puffs every 4-6 hours as needed.  Since the last visit, she has done well. She remains Singulair 10mg  daily. She also reports that it helps with her sleeping and snoring as well. She does work 3rd shift occasionally but typically has normal daytime hours. She has not used her rescue inhaler in several months. She does use it prior to physical activity. Last prednisone course was over one year ago. She uses daily apple cider vinegar mixed  with honey and water which seems to help with her sinuses. She remains on the Zyrtec 10mg  daily and the Nasonex 1-2 sprays per nostril daily.   Otherwise, there have been no changes to her past medical history, surgical history, family history, or social history.    Review of Systems: a 14-point review of systems is pertinent for what is mentioned in HPI.  Otherwise, all other systems were negative. Constitutional: negative other than that listed in the HPI Eyes: negative other than that listed in the HPI Ears, nose, mouth, throat, and face: negative other than that listed in the HPI Respiratory: negative other than that listed in the HPI Cardiovascular: negative other than that listed in the HPI Gastrointestinal: negative other than that listed in the HPI Genitourinary: negative other than that listed in the HPI Integument: negative other than that listed in the HPI Hematologic: negative other than that listed in the HPI Musculoskeletal: negative other than that listed in the HPI Neurological: negative other than that listed in the HPI Allergy/Immunologic: negative other than that listed in the HPI    Objective:   Blood pressure 116/66, pulse 99, temperature 98.8 F (37.1 C), temperature source Oral, resp. rate 18, height 5' 3.5" (1.613 m), weight 198 lb 6.4 oz (90 kg), SpO2 96 %. Body mass index is 34.59 kg/m.   Physical Exam:  General: Alert, interactive, in no acute distress. Cooperative with the exam. Very personable female.  Eyes: No conjunctival injection present on the right, No conjunctival injection present on the  left, PERRL bilaterally, No discharge on the right and No discharge on the left Ears: Right TM pearly gray with normal light reflex, Left TM pearly gray with normal light reflex, Right TM intact without perforation and Left TM intact without perforation.  Nose/Throat: External nose within normal limits and septum midline, turbinates edematous without discharge,  post-pharynx mildly erythematous without cobblestoning in the posterior oropharynx. Tonsils 2+ without exudates Neck: Supple without thyromegaly. Lungs: Clear to auscultation without wheezing, rhonchi or rales. No increased work of breathing. CV: Normal S1/S2, no murmurs. Capillary refill <2 seconds.  Skin: Warm and dry, without lesions or rashes. Neuro:   Grossly intact. No focal deficits appreciated. Responsive to questions.   Diagnostic studies:  Spirometry: results normal (FEV1: 1.98/88%, FVC: 2.17/79%, FEV1/FVC: 91%).    Spirometry consistent with normal pattern.   Allergy Studies: None    Salvatore Marvel, MD [place] [facility] [place]

## 2016-06-24 NOTE — Patient Instructions (Addendum)
1. Mild persistent asthma, uncomplicated - Lung function looked stable today. - Continue with Singulair 10mg  daily. - Use ProAir 4 puffs every 4-6 hours as needed for coughing/wheezing.  2. Perennial allergic rhinitis - Continue with cetirizine 10mg  daily.  - Continue with Nasonex 2 sprays per nostril daily.  3. Return in about 6 months (around 12/23/2016).  Please inform us of any Emergency Department visits, hospitalizations, or changes in symptoms. Call us before going to the ED for breathing or allergy symptoms since we might be able to fit you in for a sick visit. Feel free to contact us anytime with any questions, problems, or concerns.  It was a pleasure to meet you today! Have a wonderful holiday season!   Websites that have reliable patient information: 1. American Academy of Asthma, Allergy, and Immunology: www.aaaai.org 2. Food Allergy Research and Education (FARE): foodallergy.org 3. Mothers of Asthmatics: http://www.asthmacommunitynetwork.org 4. American College of Allergy, Asthma, and Immunology: www.acaai.org

## 2016-06-25 ENCOUNTER — Other Ambulatory Visit: Payer: Self-pay | Admitting: Obstetrics and Gynecology

## 2016-06-25 DIAGNOSIS — Z1231 Encounter for screening mammogram for malignant neoplasm of breast: Secondary | ICD-10-CM

## 2016-07-14 ENCOUNTER — Ambulatory Visit
Admission: RE | Admit: 2016-07-14 | Discharge: 2016-07-14 | Disposition: A | Discharge status: Home / Self Care | Payer: 59 | Source: Ambulatory Visit | Attending: Obstetrics and Gynecology | Admitting: Obstetrics and Gynecology

## 2016-07-14 DIAGNOSIS — Z1231 Encounter for screening mammogram for malignant neoplasm of breast: Secondary | ICD-10-CM | POA: Diagnosis not present

## 2016-07-26 ENCOUNTER — Other Ambulatory Visit: Payer: Self-pay

## 2016-07-26 MED ORDER — MOMETASONE FUROATE 220 MCG/INH IN AEPB
2.0000 | INHALATION_SPRAY | Freq: Two times a day (BID) | RESPIRATORY_TRACT | 2 refills | Status: DC
Start: 2016-07-26 — End: 2016-09-28

## 2016-07-27 ENCOUNTER — Other Ambulatory Visit: Payer: Self-pay | Admitting: Gastroenterology

## 2016-07-27 DIAGNOSIS — R1033 Periumbilical pain: Secondary | ICD-10-CM | POA: Diagnosis not present

## 2016-07-27 DIAGNOSIS — R140 Abdominal distension (gaseous): Secondary | ICD-10-CM | POA: Diagnosis not present

## 2016-07-27 DIAGNOSIS — R1011 Right upper quadrant pain: Secondary | ICD-10-CM | POA: Diagnosis not present

## 2016-08-03 ENCOUNTER — Ambulatory Visit (HOSPITAL_COMMUNITY)
Admission: RE | Admit: 2016-08-03 | Discharge: 2016-08-03 | Disposition: A | Discharge status: Home / Self Care | Payer: 59 | Source: Ambulatory Visit | Attending: Gastroenterology | Admitting: Gastroenterology

## 2016-08-03 DIAGNOSIS — R1011 Right upper quadrant pain: Secondary | ICD-10-CM

## 2016-08-03 MED ORDER — TECHNETIUM TC 99M MEBROFENIN IV KIT
5.2000 | PACK | Freq: Once | INTRAVENOUS | Status: AC | PRN
  Administered 2016-08-03: 5.2 via INTRAVENOUS

## 2016-08-11 DIAGNOSIS — K828 Other specified diseases of gallbladder: Secondary | ICD-10-CM | POA: Diagnosis not present

## 2016-08-11 DIAGNOSIS — R1033 Periumbilical pain: Secondary | ICD-10-CM | POA: Diagnosis not present

## 2016-08-11 DIAGNOSIS — R1011 Right upper quadrant pain: Secondary | ICD-10-CM | POA: Diagnosis not present

## 2016-08-25 ENCOUNTER — Ambulatory Visit: Payer: Self-pay | Admitting: Surgery

## 2016-08-25 NOTE — H&P (Signed)
Samantha Mcgee 08/25/2016 9:27 AM Location: [facility] Patient #: [id number] DOB: [birth year] Married / Language: English / Race: Black or African American Female  History of Present Illness Marcello Moores A. Miosotis Wetsel MD; 08/25/2016 10:22 AM) Patient words: gb   Patient sent at the request of Dr. Collene Mares for epigastric abdominal pain. The patient is a long-standing history of epigastric abdominal pain, back pain, diarrhea and overall feeling of gastrointestinal on arrest after eating. Fatty seem to make this worse. Ultrasound was done which showed no gallstones but HIDA with ejection fraction showed her ejection fraction of 34%. Patient is unsure if she develops symptoms with the insure they gave her for the test. She has had symptoms for many years. She has been treated for multiple GI conditions her symptoms have not improved. Her symptoms seem to be worse with eating. She does give a history of fatty foods making it worse.  The patient is a 51 year old female.   Past Surgical History Sharyn Lull R. Rolena Infante, CMA; 08/25/2016 9:28 AM) Cesarean Section - 1 Foot Surgery Bilateral.  Diagnostic Studies History Sharyn Lull R. Rolena Infante, CMA; 08/25/2016 9:28 AM) Colonoscopy >10 years ago Mammogram within last year  Allergies Sharyn Lull R. Mcgee, CMA; 08/25/2016 9:29 AM) Latex  Medication History Sharyn Lull R. Mcgee, [facility]; 08/25/2016 9:33 AM) Amitriptyline HCl (10MG  Tablet, Oral) Active. Elmiron (100MG  Capsule, Oral) Active. Montelukast Sodium (10MG  Tablet, Oral) Active. Cetirizine HCl (10MG  Tablet, Oral) Active. Asmanex 30 Metered Doses (220MCG/INH Aero Pow Br Act, Inhalation) Active. Calcium (Oral) Specific strength unknown - Active. Vitamin D (Oral) Specific strength unknown - Active. Vitamin B-12 (Oral) Specific strength unknown - Active. Omega 3 (Oral) Specific strength unknown - Active. Medications Reconciled  Social History Sharyn Lull R. Mcgee, CMA; 08/25/2016 9:28  AM) Alcohol use Moderate alcohol use. Caffeine use Coffee. No drug use Tobacco use Former smoker.  Family History Sharyn Lull R. Rolena Infante, CMA; 08/25/2016 9:28 AM) Arthritis Father, Mother. Cerebrovascular Accident Mother. Heart Disease Family Members In General, Father. Hypertension Brother, Father, Mother. Migraine Headache Mother.  Pregnancy / Birth History Sharyn Lull R. Rolena Infante, CMA; 08/25/2016 9:28 AM) Age at menarche 32 years. Contraceptive History Intrauterine device. Gravida 2 Irregular periods Length (months) of breastfeeding 3-6 Maternal age 50-40 Para 1  Other Problems Sharyn Lull R. Mcgee, CMA; 08/25/2016 9:28 AM) Arthritis Asthma Back Pain Bladder Problems Gastroesophageal Reflux Disease Hemorrhoids Kidney Stone Migraine Headache     Review of Systems Samantha Mcgee CMA; 08/25/2016 9:28 AM) General Present- Chills, Fatigue and Weight Gain. Not Present- Appetite Loss, Fever, Night Sweats and Weight Loss. Skin Not Present- Change in Wart/Mole, Dryness, Hives, Jaundice, New Lesions, Non-Healing Wounds, Rash and Ulcer. HEENT Present- Seasonal Allergies and Wears glasses/contact lenses. Not Present- Earache, Hearing Loss, Hoarseness, Nose Bleed, Oral Ulcers, Ringing in the Ears, Sinus Pain, Sore Throat, Visual Disturbances and Yellow Eyes. Respiratory Not Present- Bloody sputum, Chronic Cough, Difficulty Breathing, Snoring and Wheezing. Breast Not Present- Breast Mass, Breast Pain, Nipple Discharge and Skin Changes. Cardiovascular Not Present- Chest Pain, Difficulty Breathing Lying Down, Leg Cramps, Palpitations, Rapid Heart Rate, Shortness of Breath and Swelling of Extremities. Gastrointestinal Present- Abdominal Pain, Bloating, Excessive gas and Nausea. Not Present- Bloody Stool, Change in Bowel Habits, Chronic diarrhea, Constipation, Difficulty Swallowing, Gets full quickly at meals, Hemorrhoids, Indigestion, Rectal Pain and Vomiting. Female  Genitourinary Not Present- Frequency, Nocturia, Painful Urination, Pelvic Pain and Urgency. Musculoskeletal Present- Back Pain. Not Present- Joint Pain, Joint Stiffness, Muscle Pain, Muscle Weakness and Swelling of Extremities. Neurological Present- Tingling. Not Present- Decreased Memory,  Fainting, Headaches, Numbness, Seizures, Tremor, Trouble walking and Weakness. Endocrine Not Present- Cold Intolerance, Excessive Hunger, Hair Changes, Heat Intolerance, Hot flashes and New Diabetes. Hematology Not Present- Blood Thinners, Easy Bruising, Excessive bleeding, Gland problems, HIV and Persistent Infections.  Vitals [employer] R. Mcgee CMA; 08/25/2016 9:28 AM) 08/25/2016 9:27 AM Weight: 198.25 lb Height: 64in Body Surface Area: 1.95 m Body Mass Index: 34.03 kg/m  Pulse: 99 (Regular)  BP: 102/66 (Sitting, Left Arm, Standard)      Physical Exam (Sirenia Whitis A. Trigg Delarocha MD; 08/25/2016 10:23 AM)  General Mental Status-Alert. General Appearance-Consistent with stated age. Hydration-Well hydrated. Voice-Normal.  Head and Neck Head-normocephalic, atraumatic with no lesions or palpable masses.  Eye Eyeball - Bilateral-Extraocular movements intact. Sclera/Conjunctiva - Bilateral-No scleral icterus.  Chest and Lung Exam Chest and lung exam reveals -quiet, even and easy respiratory effort with no use of accessory muscles and on auscultation, normal breath sounds, no adventitious sounds and normal vocal resonance. Inspection Chest Wall - Normal. Back - normal.  Cardiovascular Cardiovascular examination reveals -on palpation PMI is normal in location and amplitude, no palpable S3 or S4. Normal cardiac borders., normal heart sounds, regular rate and rhythm with no murmurs, carotid auscultation reveals no bruits and normal pedal pulses bilaterally.  Abdomen Inspection Inspection of the abdomen reveals - No Hernias. Skin - Scar - no surgical  scars. Palpation/Percussion Palpation and Percussion of the abdomen reveal - Soft, Non Tender, No Rebound tenderness, No Rigidity (guarding) and No hepatosplenomegaly. Auscultation Auscultation of the abdomen reveals - Bowel sounds normal.  Neurologic Neurologic evaluation reveals -alert and oriented x 3 with no impairment of recent or remote memory. Mental Status-Normal.  Musculoskeletal Normal Exam - Left-Upper Extremity Strength Normal and Lower Extremity Strength Normal. Normal Exam - Right-Upper Extremity Strength Normal, Lower Extremity Weakness.    Assessment & Plan (Christ Fullenwider A. Omran Keelin MD; 08/25/2016 10:25 AM)  BILIARY DYSKINESIA (K82.8) Impression: Discussed surgical treatment of this condition. Discussed the pros and cons of surgery as well as complications of surgery and long-term expectations after surgery. Discussed success rates of 75% or so with surgical therapy FOR THIS. Discussed potential complications of cholecystectomy with her as well. Discussed potential long-term expectations, dietary changes and the fact that this may not help her condition. She position in proceeding and will think this over. We'll make arrangements for laparoscopic cholecystectomy cholangiogram. The procedure has been discussed with the patient. Risks of laparoscopic cholecystectomy include bleeding, infection, bile duct injury, leak, death, open surgery, diarrhea, other surgery, organ injury, blood vessel injury, DVT, and additional care.  Current Plans You are being scheduled for surgery- Our schedulers will call you.  You should hear from our office's scheduling department within 5 working days about the location, date, and time of surgery. We try to make accommodations for patient's preferences in scheduling surgery, but sometimes the OR schedule or the surgeon's schedule prevents [place] from making those accommodations.  If you have not heard from our office [phone]) in 5 working  days, call the office and ask for your surgeon's nurse.  If you have other questions about your diagnosis, plan, or surgery, call the office and ask for your surgeon's nurse.  Pt Education - Pamphlet Given - Laparoscopic Gallbladder Surgery: discussed with patient and provided information. The anatomy & physiology of hepatobiliary & pancreatic function was discussed. The pathophysiology of gallbladder dysfunction was discussed. Natural history risks without surgery was discussed. I feel the risks of no intervention will lead to serious problems that outweigh the operative risks;  therefore, I recommended cholecystectomy to remove the pathology. I explained laparoscopic techniques with possible need for an open approach. Probable cholangiogram to evaluate the bilary tract was explained as well.  Risks such as bleeding, infection, abscess, leak, injury to other organs, need for further treatment, heart attack, death, and other risks were discussed. I noted a good likelihood this will help address the problem. Possibility that this will not correct all abdominal symptoms was explained. Goals of post-operative recovery were discussed as well. We will work to minimize complications. An educational handout further explaining the pathology and treatment options was given as well. Questions were answered. The patient expresses understanding & wishes to proceed with surgery.  Pt Education - [place] ([place])

## 2016-09-28 ENCOUNTER — Telehealth: Payer: Self-pay

## 2016-09-28 MED ORDER — MOMETASONE FUROATE 220 MCG/INH IN AEPB: INHALATION_SPRAY | RESPIRATORY_TRACT | 5 refills | Status: DC

## 2016-09-28 NOTE — Telephone Encounter (Signed)
Robin from [facility] called for clarification on Asmanex Rx. I did not see Asmanex listed on patient's last OV, so I double checked with Dr. Ernst Bowler who said to send in Rx for Asmanex. New Rx for Asmanex sent to [facility].

## 2016-10-01 NOTE — Pre-Procedure Instructions (Signed)
    JACOBA CHERNEY  10/01/2016      [facility] [place] [address] [place] [address] Phone: [phone] Fax: [phone]  [facility] - [address], [place] [address] Phone: [phone] Fax: [phone]    Your procedure is scheduled on Thur. Mar. 29 at 1230 PM.  Report to [facility] at 1030 AM.  Call this number if you have problems the morning of surgery: [phone]   Remember:  Do not eat food or drink liquids after midnight.  Take these medicines the morning of surgery with A SIP OF WATER albuterol (Proair HFA), cetirizine (zyrtec), linaclotide (linzess), mometasone (Asmanex) inhaler, montelukast (singular).  Stop taking any Vitamins or Herbal Medications. No Aspirin Goody's, BC's, Aleve, Advil, Motrin, Ibuprofen or Fish oil.   Do not wear jewelry, make-up or nail polish.  Do not wear lotions, powders, or perfumes, or deoderant.  Do not shave 48 hours prior to surgery.  Men may shave face and neck.  Do not bring valuables to the hospital.  [facility] is not responsible for any belongings or valuables.  Contacts, dentures or bridgework may not be worn into surgery.  Leave your suitcase in the car.  After surgery it may be brought to your room.  For patients admitted to the hospital, discharge time will be determined by your treatment team.  Patients discharged the day of surgery will not be allowed to drive home.    Special instructions:  See attached  Please read over the following fact sheets that you were given. Coughing and Deep Breathing, Blood Transfusion Information, MRSA Information and Surgical Site Infection Prevention

## 2016-10-04 ENCOUNTER — Encounter (HOSPITAL_COMMUNITY)
Admission: RE | Admit: 2016-10-04 | Discharge: 2016-10-04 | Disposition: A | Discharge status: Home / Self Care | Payer: Managed Care, Other (non HMO) | Source: Ambulatory Visit | Attending: Surgery | Admitting: Surgery

## 2016-10-04 ENCOUNTER — Encounter (HOSPITAL_COMMUNITY): Payer: Self-pay

## 2016-10-04 DIAGNOSIS — Z820 Family history of epilepsy and other diseases of the nervous system: Secondary | ICD-10-CM | POA: Diagnosis not present

## 2016-10-04 DIAGNOSIS — Z01812 Encounter for preprocedural laboratory examination: Secondary | ICD-10-CM | POA: Insufficient documentation

## 2016-10-04 DIAGNOSIS — K828 Other specified diseases of gallbladder: Secondary | ICD-10-CM | POA: Insufficient documentation

## 2016-10-04 DIAGNOSIS — K811 Chronic cholecystitis: Secondary | ICD-10-CM | POA: Diagnosis not present

## 2016-10-04 DIAGNOSIS — M1990 Unspecified osteoarthritis, unspecified site: Secondary | ICD-10-CM | POA: Diagnosis not present

## 2016-10-04 DIAGNOSIS — Z9889 Other specified postprocedural states: Secondary | ICD-10-CM | POA: Diagnosis not present

## 2016-10-04 DIAGNOSIS — N736 Female pelvic peritoneal adhesions (postinfective): Secondary | ICD-10-CM | POA: Diagnosis not present

## 2016-10-04 DIAGNOSIS — Z87442 Personal history of urinary calculi: Secondary | ICD-10-CM | POA: Diagnosis not present

## 2016-10-04 DIAGNOSIS — Z975 Presence of (intrauterine) contraceptive device: Secondary | ICD-10-CM | POA: Diagnosis not present

## 2016-10-04 DIAGNOSIS — Z87891 Personal history of nicotine dependence: Secondary | ICD-10-CM | POA: Diagnosis not present

## 2016-10-04 DIAGNOSIS — J45909 Unspecified asthma, uncomplicated: Secondary | ICD-10-CM | POA: Diagnosis not present

## 2016-10-04 DIAGNOSIS — Z8249 Family history of ischemic heart disease and other diseases of the circulatory system: Secondary | ICD-10-CM | POA: Diagnosis not present

## 2016-10-04 DIAGNOSIS — Z8261 Family history of arthritis: Secondary | ICD-10-CM | POA: Diagnosis not present

## 2016-10-04 DIAGNOSIS — Z79899 Other long term (current) drug therapy: Secondary | ICD-10-CM | POA: Diagnosis not present

## 2016-10-04 DIAGNOSIS — K819 Cholecystitis, unspecified: Secondary | ICD-10-CM | POA: Diagnosis present

## 2016-10-04 DIAGNOSIS — Z91040 Latex allergy status: Secondary | ICD-10-CM | POA: Diagnosis not present

## 2016-10-04 DIAGNOSIS — Z823 Family history of stroke: Secondary | ICD-10-CM | POA: Diagnosis not present

## 2016-10-04 HISTORY — DX: Personal history of urinary calculi: Z87.442

## 2016-10-04 HISTORY — DX: Headache, unspecified: R51.9

## 2016-10-04 HISTORY — DX: Unspecified osteoarthritis, unspecified site: M19.90

## 2016-10-04 HISTORY — DX: Headache: R51

## 2016-10-04 HISTORY — DX: Anxiety disorder, unspecified: F41.9

## 2016-10-04 LAB — COMPREHENSIVE METABOLIC PANEL
ALT: 19 U/L (ref 14–54)
AST: 21 U/L (ref 15–41)
Albumin: 3.7 g/dL (ref 3.5–5.0)
Alkaline Phosphatase: 65 U/L (ref 38–126)
Anion gap: 11 (ref 5–15)
BUN: 10 mg/dL (ref 6–20)
CO2: 20 mmol/L — ABNORMAL LOW (ref 22–32)
Calcium: 9 mg/dL (ref 8.9–10.3)
Chloride: 108 mmol/L (ref 101–111)
Creatinine, Ser: 0.75 mg/dL (ref 0.44–1.00)
GFR calc Af Amer: >60 mL/min (ref >60)
GFR calc non Af Amer: >60 mL/min (ref >60)
Glucose, Bld: 95 mg/dL (ref 65–99)
Potassium: 3.8 mmol/L (ref 3.5–5.1)
Sodium: 139 mmol/L (ref 135–145)
Total Bilirubin: 0.3 mg/dL (ref 0.3–1.2)
Total Protein: 7 g/dL (ref 6.5–8.1)

## 2016-10-04 LAB — CBC WITH DIFFERENTIAL/PLATELET
Basophils Absolute: 0.1 10*3/uL (ref 0.0–0.1)
Basophils Relative: 1 %
Eosinophils Absolute: 0.1 10*3/uL (ref 0.0–0.7)
Eosinophils Relative: 2 %
HCT: 41.3 % (ref 36.0–46.0)
Hemoglobin: 13.5 g/dL (ref 12.0–15.0)
Lymphocytes Relative: 33 %
Lymphs Abs: 2.5 10*3/uL (ref 0.7–4.0)
MCH: 27.9 pg (ref 26.0–34.0)
MCHC: 32.7 g/dL (ref 30.0–36.0)
MCV: 85.3 fL (ref 78.0–100.0)
Monocytes Absolute: 0.7 10*3/uL (ref 0.1–1.0)
Monocytes Relative: 10 %
Neutro Abs: 4.2 10*3/uL (ref 1.7–7.7)
Neutrophils Relative %: 54 %
Platelets: 313 10*3/uL (ref 150–400)
RBC: 4.84 MIL/uL (ref 3.87–5.11)
RDW: 13.1 % (ref 11.5–15.5)
WBC: 7.5 10*3/uL (ref 4.0–10.5)

## 2016-10-04 LAB — HCG, SERUM, QUALITATIVE: Preg, Serum: NEGATIVE

## 2016-10-04 MED ORDER — CHLORHEXIDINE GLUCONATE CLOTH 2 % EX PADS: 6.0000 | MEDICATED_PAD | Freq: Once | CUTANEOUS | Status: DC

## 2016-10-06 MED ORDER — DEXTROSE 5 % IV SOLN
3.0000 g | INTRAVENOUS | Status: AC
  Administered 2016-10-07: 3 g via INTRAVENOUS
  Filled 2016-10-06: qty 3000

## 2016-10-07 ENCOUNTER — Ambulatory Visit (HOSPITAL_COMMUNITY): Payer: Managed Care, Other (non HMO) | Admitting: Anesthesiology

## 2016-10-07 ENCOUNTER — Encounter (HOSPITAL_COMMUNITY)
Admission: RE | Disposition: A | Discharge status: Home / Self Care | Payer: Self-pay | Source: Ambulatory Visit | Attending: Surgery

## 2016-10-07 ENCOUNTER — Ambulatory Visit (HOSPITAL_COMMUNITY): Payer: Managed Care, Other (non HMO)

## 2016-10-07 ENCOUNTER — Ambulatory Visit (HOSPITAL_COMMUNITY)
Admission: RE | Admit: 2016-10-07 | Discharge: 2016-10-07 | Disposition: A | Discharge status: Home / Self Care | Payer: Managed Care, Other (non HMO) | Source: Ambulatory Visit | Attending: Surgery | Admitting: Surgery

## 2016-10-07 ENCOUNTER — Encounter (HOSPITAL_COMMUNITY): Payer: Self-pay | Admitting: *Deleted

## 2016-10-07 DIAGNOSIS — K811 Chronic cholecystitis: Secondary | ICD-10-CM | POA: Insufficient documentation

## 2016-10-07 DIAGNOSIS — J45909 Unspecified asthma, uncomplicated: Secondary | ICD-10-CM | POA: Insufficient documentation

## 2016-10-07 DIAGNOSIS — Z79899 Other long term (current) drug therapy: Secondary | ICD-10-CM | POA: Insufficient documentation

## 2016-10-07 DIAGNOSIS — Z9889 Other specified postprocedural states: Secondary | ICD-10-CM | POA: Insufficient documentation

## 2016-10-07 DIAGNOSIS — Z91040 Latex allergy status: Secondary | ICD-10-CM | POA: Insufficient documentation

## 2016-10-07 DIAGNOSIS — Z975 Presence of (intrauterine) contraceptive device: Secondary | ICD-10-CM | POA: Insufficient documentation

## 2016-10-07 DIAGNOSIS — Z823 Family history of stroke: Secondary | ICD-10-CM | POA: Insufficient documentation

## 2016-10-07 DIAGNOSIS — K828 Other specified diseases of gallbladder: Secondary | ICD-10-CM | POA: Diagnosis not present

## 2016-10-07 DIAGNOSIS — N736 Female pelvic peritoneal adhesions (postinfective): Secondary | ICD-10-CM | POA: Diagnosis not present

## 2016-10-07 DIAGNOSIS — Z87891 Personal history of nicotine dependence: Secondary | ICD-10-CM | POA: Insufficient documentation

## 2016-10-07 DIAGNOSIS — Z8249 Family history of ischemic heart disease and other diseases of the circulatory system: Secondary | ICD-10-CM | POA: Insufficient documentation

## 2016-10-07 DIAGNOSIS — Z419 Encounter for procedure for purposes other than remedying health state, unspecified: Secondary | ICD-10-CM

## 2016-10-07 DIAGNOSIS — Z820 Family history of epilepsy and other diseases of the nervous system: Secondary | ICD-10-CM | POA: Insufficient documentation

## 2016-10-07 DIAGNOSIS — M1990 Unspecified osteoarthritis, unspecified site: Secondary | ICD-10-CM | POA: Insufficient documentation

## 2016-10-07 DIAGNOSIS — Z8261 Family history of arthritis: Secondary | ICD-10-CM | POA: Insufficient documentation

## 2016-10-07 DIAGNOSIS — Z87442 Personal history of urinary calculi: Secondary | ICD-10-CM | POA: Insufficient documentation

## 2016-10-07 HISTORY — PX: CHOLECYSTECTOMY: SHX55

## 2016-10-07 SURGERY — LAPAROSCOPIC CHOLECYSTECTOMY WITH INTRAOPERATIVE CHOLANGIOGRAM
Anesthesia: General | Site: Abdomen

## 2016-10-07 MED ORDER — HYDROMORPHONE HCL 1 MG/ML IJ SOLN
INTRAMUSCULAR | Status: DC
Start: 2016-10-07 — End: 2016-10-07
  Filled 2016-10-07: qty 0.5

## 2016-10-07 MED ORDER — BUPIVACAINE HCL (PF) 0.25 % IJ SOLN
INTRAMUSCULAR | Status: DC | PRN
  Administered 2016-10-07: 6 mL

## 2016-10-07 MED ORDER — SODIUM CHLORIDE 0.9 % IV SOLN
INTRAVENOUS | Status: DC | PRN
  Administered 2016-10-07: 9 mL

## 2016-10-07 MED ORDER — PROPOFOL 10 MG/ML IV BOLUS
INTRAVENOUS | Status: DC | PRN
  Administered 2016-10-07: 160 mg via INTRAVENOUS

## 2016-10-07 MED ORDER — FENTANYL CITRATE (PF) 250 MCG/5ML IJ SOLN
INTRAMUSCULAR | Status: AC
  Filled 2016-10-07: qty 5

## 2016-10-07 MED ORDER — KETOROLAC TROMETHAMINE 30 MG/ML IJ SOLN
INTRAMUSCULAR | Status: AC
  Filled 2016-10-07: qty 1

## 2016-10-07 MED ORDER — EPHEDRINE 5 MG/ML INJ
INTRAVENOUS | Status: AC
  Filled 2016-10-07: qty 10

## 2016-10-07 MED ORDER — ONDANSETRON HCL 4 MG/2ML IJ SOLN
INTRAMUSCULAR | Status: DC | PRN
  Administered 2016-10-07: 4 mg via INTRAVENOUS

## 2016-10-07 MED ORDER — OXYCODONE HCL 5 MG PO TABS
ORAL_TABLET | ORAL | Status: AC
  Administered 2016-10-07: 5 mg via ORAL
  Filled 2016-10-07: qty 1

## 2016-10-07 MED ORDER — LIDOCAINE 2% (20 MG/ML) 5 ML SYRINGE
INTRAMUSCULAR | Status: AC
  Filled 2016-10-07: qty 5

## 2016-10-07 MED ORDER — MIDAZOLAM HCL 5 MG/5ML IJ SOLN
INTRAMUSCULAR | Status: DC | PRN
  Administered 2016-10-07: 2 mg via INTRAVENOUS

## 2016-10-07 MED ORDER — ONDANSETRON HCL 4 MG PO TABS: 4.0000 mg | ORAL_TABLET | Freq: Three times a day (TID) | ORAL | 0 refills | Status: DC | PRN

## 2016-10-07 MED ORDER — SUGAMMADEX SODIUM 200 MG/2ML IV SOLN
INTRAVENOUS | Status: DC | PRN
  Administered 2016-10-07: 180 mg via INTRAVENOUS

## 2016-10-07 MED ORDER — IOPAMIDOL (ISOVUE-300) INJECTION 61%
INTRAVENOUS | Status: AC
  Filled 2016-10-07: qty 50

## 2016-10-07 MED ORDER — KETOROLAC TROMETHAMINE 30 MG/ML IJ SOLN
30.0000 mg | Freq: Once | INTRAMUSCULAR | Status: DC | PRN
  Administered 2016-10-07: 30 mg via INTRAVENOUS

## 2016-10-07 MED ORDER — ROCURONIUM BROMIDE 50 MG/5ML IV SOSY
PREFILLED_SYRINGE | INTRAVENOUS | Status: DC | PRN
  Administered 2016-10-07: 50 mg via INTRAVENOUS

## 2016-10-07 MED ORDER — HYDROMORPHONE HCL 1 MG/ML IJ SOLN
INTRAMUSCULAR | Status: AC
  Filled 2016-10-07: qty 0.5

## 2016-10-07 MED ORDER — MIDAZOLAM HCL 2 MG/2ML IJ SOLN
INTRAMUSCULAR | Status: AC
  Filled 2016-10-07: qty 2

## 2016-10-07 MED ORDER — LACTATED RINGERS IV SOLN
INTRAVENOUS | Status: DC
  Administered 2016-10-07 (×2): via INTRAVENOUS

## 2016-10-07 MED ORDER — FENTANYL CITRATE (PF) 100 MCG/2ML IJ SOLN
INTRAMUSCULAR | Status: DC | PRN
  Administered 2016-10-07 (×4): 50 ug via INTRAVENOUS

## 2016-10-07 MED ORDER — SODIUM CHLORIDE 0.9 % IR SOLN
Status: DC | PRN
  Administered 2016-10-07 (×2): 1000 mL

## 2016-10-07 MED ORDER — PROMETHAZINE HCL 25 MG/ML IJ SOLN: 6.2500 mg | INTRAMUSCULAR | Status: DC | PRN

## 2016-10-07 MED ORDER — OXYCODONE HCL 5 MG PO TABS: 5.0000 mg | ORAL_TABLET | Freq: Four times a day (QID) | ORAL | 0 refills | Status: DC | PRN

## 2016-10-07 MED ORDER — BUPIVACAINE HCL (PF) 0.25 % IJ SOLN
INTRAMUSCULAR | Status: AC
  Filled 2016-10-07: qty 30

## 2016-10-07 MED ORDER — OXYCODONE HCL 5 MG PO TABS
5.0000 mg | ORAL_TABLET | Freq: Once | ORAL | Status: AC
  Administered 2016-10-07: 5 mg via ORAL

## 2016-10-07 MED ORDER — 0.9 % SODIUM CHLORIDE (POUR BTL) OPTIME
TOPICAL | Status: DC | PRN
Start: 2016-10-07 — End: 2016-10-07
  Administered 2016-10-07: 1000 mL

## 2016-10-07 MED ORDER — LIDOCAINE 2% (20 MG/ML) 5 ML SYRINGE
INTRAMUSCULAR | Status: DC | PRN
  Administered 2016-10-07: 60 mg via INTRAVENOUS

## 2016-10-07 MED ORDER — DEXAMETHASONE SODIUM PHOSPHATE 10 MG/ML IJ SOLN
INTRAMUSCULAR | Status: DC | PRN
  Administered 2016-10-07: 10 mg via INTRAVENOUS

## 2016-10-07 MED ORDER — PHENYLEPHRINE 40 MCG/ML (10ML) SYRINGE FOR IV PUSH (FOR BLOOD PRESSURE SUPPORT)
PREFILLED_SYRINGE | INTRAVENOUS | Status: DC | PRN
  Administered 2016-10-07: 200 ug via INTRAVENOUS
  Administered 2016-10-07: 80 ug via INTRAVENOUS
  Administered 2016-10-07: 120 ug via INTRAVENOUS

## 2016-10-07 MED ORDER — HYDROMORPHONE HCL 1 MG/ML IJ SOLN
0.2500 mg | INTRAMUSCULAR | Status: DC | PRN
  Administered 2016-10-07 (×2): 0.5 mg via INTRAVENOUS

## 2016-10-07 SURGICAL SUPPLY — 42 items
ADH SKN CLS APL DERMABOND .7 (GAUZE/BANDAGES/DRESSINGS) ×1
APPLIER CLIP ROT 10 11.4 M/L (STAPLE) ×2
APR CLP MED LRG 11.4X10 (STAPLE) ×1
BAG SPEC RTRVL LRG 6X4 10 (ENDOMECHANICALS) ×1
BLADE CLIPPER SURG (BLADE) IMPLANT
CANISTER SUCT 3000ML PPV (MISCELLANEOUS) ×2 IMPLANT
CHLORAPREP W/TINT 26ML (MISCELLANEOUS) ×2 IMPLANT
CLIP APPLIE ROT 10 11.4 M/L (STAPLE) ×1 IMPLANT
COVER MAYO STAND STRL (DRAPES) ×2 IMPLANT
COVER SURGICAL LIGHT HANDLE (MISCELLANEOUS) ×2 IMPLANT
DECANTER SPIKE VIAL GLASS SM (MISCELLANEOUS) ×1 IMPLANT
DERMABOND ADVANCED (GAUZE/BANDAGES/DRESSINGS) ×1
DERMABOND ADVANCED .7 DNX12 (GAUZE/BANDAGES/DRESSINGS) ×1 IMPLANT
DRAPE C-ARM 42X72 X-RAY (DRAPES) ×2 IMPLANT
DRAPE WARM FLUID 44X44 (DRAPE) ×1 IMPLANT
ELECT REM PT RETURN 9FT ADLT (ELECTROSURGICAL) ×2
ELECTRODE REM PT RTRN 9FT ADLT (ELECTROSURGICAL) ×1 IMPLANT
GLOVE BIO SURGEON STRL SZ8 (GLOVE) ×2 IMPLANT
GLOVE BIOGEL PI IND STRL 8 (GLOVE) ×1 IMPLANT
GLOVE BIOGEL PI INDICATOR 8 (GLOVE) ×1
GOWN STRL REUS W/ TWL LRG LVL3 (GOWN DISPOSABLE) ×2 IMPLANT
GOWN STRL REUS W/ TWL XL LVL3 (GOWN DISPOSABLE) ×1 IMPLANT
GOWN STRL REUS W/TWL LRG LVL3 (GOWN DISPOSABLE) ×4
GOWN STRL REUS W/TWL XL LVL3 (GOWN DISPOSABLE) ×2
KIT BASIN OR (CUSTOM PROCEDURE TRAY) ×2 IMPLANT
KIT ROOM TURNOVER OR (KITS) ×2 IMPLANT
NS IRRIG 1000ML POUR BTL (IV SOLUTION) ×2 IMPLANT
PAD ARMBOARD 7.5X6 YLW CONV (MISCELLANEOUS) ×2 IMPLANT
POUCH SPECIMEN RETRIEVAL 10MM (ENDOMECHANICALS) ×2 IMPLANT
SCISSORS LAP 5X35 DISP (ENDOMECHANICALS) ×2 IMPLANT
SET CHOLANGIOGRAPH 5 50 .035 (SET/KITS/TRAYS/PACK) ×2 IMPLANT
SET IRRIG TUBING LAPAROSCOPIC (IRRIGATION / IRRIGATOR) ×2 IMPLANT
SLEEVE ENDOPATH XCEL 5M (ENDOMECHANICALS) ×2 IMPLANT
SPECIMEN JAR SMALL (MISCELLANEOUS) ×2 IMPLANT
SUT MNCRL AB 4-0 PS2 18 (SUTURE) ×2 IMPLANT
TOWEL OR 17X24 6PK STRL BLUE (TOWEL DISPOSABLE) ×2 IMPLANT
TOWEL OR 17X26 10 PK STRL BLUE (TOWEL DISPOSABLE) ×2 IMPLANT
TRAY LAPAROSCOPIC MC (CUSTOM PROCEDURE TRAY) ×2 IMPLANT
TROCAR XCEL BLUNT TIP 100MML (ENDOMECHANICALS) ×2 IMPLANT
TROCAR XCEL NON-BLD 11X100MML (ENDOMECHANICALS) ×2 IMPLANT
TROCAR XCEL NON-BLD 5MMX100MML (ENDOMECHANICALS) ×2 IMPLANT
TUBING INSUFFLATION (TUBING) ×2 IMPLANT

## 2016-10-07 NOTE — Progress Notes (Signed)
Awakened from sleep, moved to P2 for d/c  /  Moved well exiting stretcher, c/o some upper back discomfort, belching after movement   /  Does not deel need to voide after 1500 mls IVF and po flds also

## 2016-10-07 NOTE — Interval H&P Note (Signed)
History and Physical Interval Note:  10/07/2016 12:22 PM  Samantha Mcgee  has presented today for surgery, with the diagnosis of Biliary dyskinesia  The various methods of treatment have been discussed with the patient and family. After consideration of risks, benefits and other options for treatment, the patient has consented to  Procedure(s): LAPAROSCOPIC CHOLECYSTECTOMY WITH INTRAOPERATIVE CHOLANGIOGRAM (N/A) as a surgical intervention .  The patient's history has been reviewed, patient examined, no change in status, stable for surgery.  I have reviewed the patient's chart and labs.  Questions were answered to the patient's satisfaction.     Ioana Louks A.

## 2016-10-07 NOTE — H&P (Signed)
Pre-op/Pre-procedure Orders Open     [facility]  Samantha Luna, MD  General Surgery   Additional Documentation   Encounter Info:   Billing Info,   History,   Allergies,   Detailed Report     All Notes   H&P by Samantha Luna, MD at  10:26 AM   Author: Erroll Luna, MD Author Type: Physician Filed: M  Note Status: Signed Cosign: Cosign Not Required Encounter Date:   Editor: Samantha Luna, MD (Physician)    Samantha Mcgee  Location: [facility] Patient #: [id number] DOB: 11/18/1965 Married / Language: English / Race: Black or African American Female  History of Present Illness Samantha Mcgee A. Samantha Leard MD;) Patient words: gb   Patient sent at the request of Dr. Collene Mcgee for epigastric abdominal pain. The patient is a long-standing history of epigastric abdominal pain, back pain, diarrhea and overall feeling of gastrointestinal on arrest after eating. Fatty seem to make this worse. Ultrasound was done which showed no gallstones but HIDA with ejection fraction showed her ejection fraction of 34%. Patient is unsure if she develops symptoms with the insure they gave her for the test. She has had symptoms for many years. She has been treated for multiple GI conditions her symptoms have not improved. Her symptoms seem to be worse with eating. She does give a history of fatty foods making it worse.  The patient is a 51 year old female.   Past Surgical History Samantha Mcgee, Mcgee;   Cesarean Section - 1 Foot Surgery Bilateral.  Diagnostic Studies History Samantha Mcgee, Mcgee; 08/25/2016 9:28 AM) Colonoscopy >10 years ago Mammogram within last year  Allergies Samantha Mcgee, Mcgee;  Latex  Medication History Samantha Mcgee, Mcgee;  Amitriptyline HCl (10MG  Tablet, Oral) Active. Elmiron (100MG  Capsule, Oral) Active. Montelukast Sodium (10MG  Tablet, Oral) Active. Cetirizine HCl (10MG  Tablet, Oral) Active. Asmanex 30  Metered Doses (220MCG/INH Aero Pow Br Act, Inhalation) Active. Calcium (Oral) Specific strength unknown - Active. Vitamin D (Oral) Specific strength unknown - Active. Vitamin B-12 (Oral) Specific strength unknown - Active. Omega 3 (Oral) Specific strength unknown - Active. Medications Reconciled  Social History Samantha Mcgee, Mcgee;  Alcohol use Moderate alcohol use. Caffeine use Coffee. No drug use Tobacco use Former smoker.  Family History Samantha Mcgee, Mcgee; Arthritis Father, Mother. Cerebrovascular Accident Mother. Heart Disease Family Members In General, Father. Hypertension Brother, Father, Mother. Migraine Headache Mother.  Pregnancy / Birth History Samantha Lull R. Mcgee, Age at menarche 55 years. Contraceptive History Intrauterine device. Gravida 2 Irregular periods Length (months) of breastfeeding 3-6 Maternal age 69-40 Para 1  Other Problems Samantha Mcgee, Mcgee;  Arthritis Asthma Back Pain Bladder Problems Gastroesophageal Reflux Disease Hemorrhoids Kidney Stone Migraine Headache     Review of Systems Samantha Mcgee;   General Present- Chills, Fatigue and Weight Gain. Not Present- Appetite Loss, Fever, Night Sweats and Weight Loss. Skin Not Present- Change in Wart/Mole, Dryness, Hives, Jaundice, New Lesions, Non-Healing Wounds, Rash and Ulcer. HEENT Present- Seasonal Allergies and Wears glasses/contact lenses. Not Present- Earache, Hearing Loss, Hoarseness, Nose Bleed, Oral Ulcers, Ringing in the Ears, Sinus Pain, Sore Throat, Visual Disturbances and Yellow Eyes. Respiratory Not Present- Bloody sputum, Chronic Cough, Difficulty Breathing, Snoring and Wheezing. Breast Not Present- Breast Mass, Breast Pain, Nipple Discharge and Skin Changes. Cardiovascular Not Present- Chest Pain, Difficulty Breathing Lying Down, Leg Cramps, Palpitations, Rapid Heart Rate, Shortness of Breath and Swelling of  Extremities. Gastrointestinal Present- Abdominal Pain, Bloating, Excessive  gas and Nausea. Not Present- Bloody Stool, Change in Bowel Habits, Chronic diarrhea, Constipation, Difficulty Swallowing, Gets full quickly at meals, Hemorrhoids, Indigestion, Rectal Pain and Vomiting. Female Genitourinary Not Present- Frequency, Nocturia, Painful Urination, Pelvic Pain and Urgency. Musculoskeletal Present- Back Pain. Not Present- Joint Pain, Joint Stiffness, Muscle Pain, Muscle Weakness and Swelling of Extremities. Neurological Present- Tingling. Not Present- Decreased Memory, Fainting, Headaches, Numbness, Seizures, Tremor, Trouble walking and Weakness. Endocrine Not Present- Cold Intolerance, Excessive Hunger, Hair Changes, Heat Intolerance, Hot flashes and New Diabetes. Hematology Not Present- Blood Thinners, Easy Bruising, Excessive bleeding, Gland problems, HIV and Persistent Infections.  Vitals Samantha Mcgee Mcgee;  08/25/2016 9:27 AM Weight: 198.25 lb Height: 64in Body Surface Area: 1.95 m Body Mass Index: 34.03 kg/m  Pulse: 99 (Regular)  BP: 102/66 (Sitting, Left Arm, Standard)      Physical Exam (Samantha Manzer A. Belton Peplinski MD; )  General Mental Status-Alert. General Appearance-Consistent with stated age. Hydration-Well hydrated. Voice-Normal.  Head and Neck Head-normocephalic, atraumatic with no lesions or palpable masses.  Eye Eyeball - Bilateral-Extraocular movements intact. Sclera/Conjunctiva - Bilateral-No scleral icterus.  Chest and Lung Exam Chest and lung exam reveals -quiet, even and easy respiratory effort with no use of accessory muscles and on auscultation, normal breath sounds, no adventitious sounds and normal vocal resonance. Inspection Chest Wall - Normal. Back - normal.  Cardiovascular Cardiovascular examination reveals -on palpation PMI is normal in location and amplitude, no palpable S3 or S4. Normal cardiac borders., normal  heart sounds, regular rate and rhythm with no murmurs, carotid auscultation reveals no bruits and normal pedal pulses bilaterally.  Abdomen Inspection Inspection of the abdomen reveals - No Hernias. Skin - Scar - no surgical scars. Palpation/Percussion Palpation and Percussion of the abdomen reveal - Soft, Non Tender, No Rebound tenderness, No Rigidity (guarding) and No hepatosplenomegaly. Auscultation Auscultation of the abdomen reveals - Bowel sounds normal.  Neurologic Neurologic evaluation reveals -alert and oriented x 3 with no impairment of recent or remote memory. Mental Status-Normal.  Musculoskeletal Normal Exam - Left-Upper Extremity Strength Normal and Lower Extremity Strength Normal. Normal Exam - Right-Upper Extremity Strength Normal, Lower Extremity Weakness.    Assessment & Plan (Zlata Alcaide A. Njeri Vicente MD; 08/25/2016 10:25 AM)  BILIARY DYSKINESIA (K82.8) Impression: Discussed surgical treatment of this condition. Discussed the pros and cons of surgery as well as complications of surgery and long-term expectations after surgery. Discussed success rates of 75% or so with surgical therapy FOR THIS. Discussed potential complications of cholecystectomy with her as well. Discussed potential long-term expectations, dietary changes and the fact that this may not help her condition. She position in proceeding and will think this over. We'll make arrangements for laparoscopic cholecystectomy cholangiogram. The procedure has been discussed with the patient. Risks of laparoscopic cholecystectomy include bleeding, infection, bile duct injury, leak, death, open surgery, diarrhea, other surgery, organ injury, blood vessel injury, DVT, and additional care.  Current Plans You are being scheduled for surgery- Our schedulers will call you.  You should hear from our office's scheduling department within 5 working days about the location, date, and time of surgery. We try to make  accommodations for patient's preferences in scheduling surgery, but sometimes the OR schedule or the surgeon's schedule prevents [place] from making those accommodations.  If you have not heard from our office [phone]) in 5 working days, call the office and ask for your surgeon's nurse.  If you have other questions about your diagnosis, plan, or surgery, call the office and ask for your  surgeon's nurse.  Pt Education - Pamphlet Given - Laparoscopic Gallbladder Surgery: discussed with patient and provided information. The anatomy & physiology of hepatobiliary & pancreatic function was discussed. The pathophysiology of gallbladder dysfunction was discussed. Natural history risks without surgery was discussed. I feel the risks of no intervention will lead to serious problems that outweigh the operative risks; therefore, I recommended cholecystectomy to remove the pathology. I explained laparoscopic techniques with possible need for an open approach. Probable cholangiogram to evaluate the bilary tract was explained as well.  Risks such as bleeding, infection, abscess, leak, injury to other organs, need for further treatment, heart attack, death, and other risks were discussed. I noted a good likelihood this will help address the problem. Possibility that this will not correct all abdominal symptoms was explained. Goals of post-operative recovery were discussed as well. We will work to minimize complications. An educational handout further explaining the pathology and treatment options was given as well. Questions were answered. The patient expresses understanding & wishes to proceed with surgery.  Pt Education - [place] ([place])

## 2016-10-07 NOTE — Anesthesia Postprocedure Evaluation (Addendum)
Anesthesia Post Note  Patient: Samantha Mcgee  Procedure(s) Performed: Procedure(s) (LRB): LAPAROSCOPIC CHOLECYSTECTOMY WITH INTRAOPERATIVE CHOLANGIOGRAM (N/A)  Patient location during evaluation: PACU Anesthesia Type: General Level of consciousness: awake and alert Pain management: pain level controlled Vital Signs Assessment: post-procedure vital signs reviewed and stable Respiratory status: spontaneous breathing, nonlabored ventilation, respiratory function stable and patient connected to nasal cannula oxygen Cardiovascular status: blood pressure returned to baseline and stable Postop Assessment: no signs of nausea or vomiting Anesthetic complications: no       Last Vitals:  Vitals:   10/07/16 1400 10/07/16 1430  BP:  111/68  Pulse:  80  Resp:  (!) 8  Temp: 36.4 C     Last Pain:  Vitals:   10/07/16 1400  TempSrc:   PainSc: Asleep                 [name]

## 2016-10-07 NOTE — Progress Notes (Signed)
Called Dr Brantley Stage to verify that patient does not have to void before discharge. Patient states she does not have to void. Will proceed with discharge.

## 2016-10-07 NOTE — Transfer of Care (Signed)
Immediate Anesthesia Transfer of Care Note  Patient: Samantha Mcgee  Procedure(s) Performed: Procedure(s): LAPAROSCOPIC CHOLECYSTECTOMY WITH INTRAOPERATIVE CHOLANGIOGRAM (N/A)  Patient Location: PACU  Anesthesia Type:General  Level of Consciousness: awake and alert   Airway & Oxygen Therapy: Patient Spontanous Breathing and Patient connected to nasal cannula oxygen  Post-op Assessment: Report given to RN, Post -op Vital signs reviewed and stable and Patient moving all extremities X 4  Post vital signs: Reviewed and stable  Last Vitals:  Vitals:   10/07/16 1028  BP: 127/76  Pulse: 94  Resp: 18  Temp: 36.8 C    Last Pain:  Vitals:   10/07/16 1100  TempSrc:   PainSc: 8       Patients Stated Pain Goal: 3 (80/99/83 3825)  Complications: No apparent anesthesia complications

## 2016-10-07 NOTE — Anesthesia Preprocedure Evaluation (Signed)
Anesthesia Evaluation  Patient identified by MRN, date of birth, ID band Patient awake    Reviewed: Allergy & Precautions, NPO status , Patient's Chart, lab work & pertinent test results  Airway Mallampati: II  TM Distance: >3 FB Neck ROM: Full    Dental no notable dental hx.    Pulmonary asthma ,    Pulmonary exam normal breath sounds clear to auscultation       Cardiovascular negative cardio ROS Normal cardiovascular exam Rhythm:Regular Rate:Normal     Neuro/Psych negative neurological ROS  negative psych ROS   GI/Hepatic negative GI ROS, Neg liver ROS,   Endo/Other  negative endocrine ROS  Renal/GU negative Renal ROS  negative genitourinary   Musculoskeletal negative musculoskeletal ROS (+)   Abdominal   Peds negative pediatric ROS (+)  Hematology negative hematology ROS (+)   Anesthesia Other Findings   Reproductive/Obstetrics negative OB ROS                             Anesthesia Physical Anesthesia Plan  ASA: II  Anesthesia Plan: General   Post-op Pain Management:    Induction: Intravenous  Airway Management Planned: Oral ETT  Additional Equipment:   Intra-op Plan:   Post-operative Plan: Extubation in OR  Informed Consent: I have reviewed the patients History and Physical, chart, labs and discussed the procedure including the risks, benefits and alternatives for the proposed anesthesia with the patient or authorized representative who has indicated his/her understanding and acceptance.   Dental advisory given  Plan Discussed with: CRNA and Surgeon  Anesthesia Plan Comments:         Anesthesia Quick Evaluation

## 2016-10-07 NOTE — Discharge Instructions (Signed)
[facility] ______[facility] °LAPAROSCOPIC SURGERY: POST OP INSTRUCTIONS °Always review your discharge instruction sheet given to you by the facility where your surgery was performed. °IF YOU HAVE DISABILITY OR FAMILY LEAVE FORMS, YOU MUST BRING THEM TO THE OFFICE FOR PROCESSING.   °DO NOT GIVE THEM TO YOUR DOCTOR. ° °1. A prescription for pain medication may be given to you upon discharge.  Take your pain medication as prescribed, if needed.  If narcotic pain medicine is not needed, then you may take acetaminophen (Tylenol) or ibuprofen (Advil) as needed. °2. Take your usually prescribed medications unless otherwise directed. °3. If you need a refill on your pain medication, please contact your pharmacy.  They will contact our office to request authorization. Prescriptions will not be filled after 5pm or on week-ends. °4. You should follow a light diet the first few days after arrival home, such as soup and crackers, etc.  Be sure to include lots of fluids daily. °5. Most patients will experience some swelling and bruising in the area of the incisions.  Ice packs will help.  Swelling and bruising can take several days to resolve.  °6. It is common to experience some constipation if taking pain medication after surgery.  Increasing fluid intake and taking a stool softener (such as Colace) will usually help or prevent this problem from occurring.  A mild laxative (Milk of Magnesia or Miralax) should be taken according to package instructions if there are no bowel movements after 48 hours. °7. Unless discharge instructions indicate otherwise, you may remove your bandages 24-48 hours after surgery, and you may shower at that time.  You may have steri-strips (small skin tapes) in place directly over the incision.  These strips should be left on the skin for 7-10 days.  If your surgeon used skin glue on the incision, you may shower in 24 hours.  The glue will flake off over the next 2-3 weeks.  Any sutures or  staples will be removed at the office during your follow-up visit. °8. ACTIVITIES:  You may resume regular (light) daily activities beginning the next day--such as daily self-care, walking, climbing stairs--gradually increasing activities as tolerated.  You may have sexual intercourse when it is comfortable.  Refrain from any heavy lifting or straining until approved by your doctor. °a. You may drive when you are no longer taking prescription pain medication, you can comfortably wear a seatbelt, and you can safely maneuver your car and apply brakes. °b. RETURN TO WORK:  __________________________________________________________ °9. You should see your doctor in the office for a follow-up appointment approximately 2-3 weeks after your surgery.  Make sure that you call for this appointment within a day or two after you arrive home to insure a convenient appointment time. °10. OTHER INSTRUCTIONS: __________________________________________________________________________________________________________________________ __________________________________________________________________________________________________________________________ °WHEN TO CALL YOUR DOCTOR: °1. Fever over 101.0 °2. Inability to urinate °3. Continued bleeding from incision. °4. Increased pain, redness, or drainage from the incision. °5. Increasing abdominal pain ° °The clinic staff is available to answer your questions during regular business hours.  Please don’t hesitate to call and ask to speak to one of the nurses for clinical concerns.  If you have a medical emergency, go to the nearest emergency room or call 911.  A surgeon from [facility] is always on call at the hospital. °[address], [address], [place], [place]  [address] ? [address] [address], [place], [place]   [address] °[phone] ? [phone] ? FAX [phone] °Web site:   [email or web address] °

## 2016-10-07 NOTE — Anesthesia Procedure Notes (Signed)
Procedure Name: Intubation Date/Time: 10/07/2016 12:48 PM Performed by: Merrilyn Puma B Pre-anesthesia Checklist: Patient identified, Suction available, Emergency Drugs available, Patient being monitored and Timeout performed Patient Re-evaluated:Patient Re-evaluated prior to inductionOxygen Delivery Method: Circle system utilized Preoxygenation: Pre-oxygenation with 100% oxygen Intubation Type: IV induction and Cricoid Pressure applied Ventilation: Mask ventilation without difficulty Laryngoscope Size: Mac and 3 Grade View: Grade II Tube type: Oral Tube size: 7.0 mm Number of attempts: 1 Airway Equipment and Method: Stylet Placement Confirmation: ETT inserted through vocal cords under direct vision,  positive ETCO2,  CO2 detector and breath sounds checked- equal and bilateral Secured at: 21 cm Tube secured with: Tape Dental Injury: Teeth and Oropharynx as per pre-operative assessment

## 2016-10-07 NOTE — Op Note (Signed)
Laparoscopic Cholecystectomy with IOC Procedure Note  Indications: This patient presents with symptomatic gallbladder disease and will undergo laparoscopic cholecystectomy. The procedure has been discussed with the patient. Operative and non operative treatments have been discussed. Risks of surgery include bleeding, infection,  Common bile duct injury,  Injury to the stomach,liver, colon,small intestine, abdominal wall,  Diaphragm,  Major blood vessels,  And the need for an open procedure.  Other risks include worsening of medical problems, death,  DVT and pulmonary embolism, and cardiovascular events.   Medical options have also been discussed. The patient has been informed of long term expectations of surgery and non surgical options,  The patient agrees to proceed.    Pre-operative Diagnosis: Other specified disorders of biliary tract  Post-operative Diagnosis: Other specified disorder of gallbladder  Surgeon: Shaquina Gillham A.   Assistants: none   Anesthesia: General endotracheal anesthesia and Local anesthesia 0.25.% bupivacaine  ASA Class: 2  Procedure Details  The patient was seen again in the Holding Room. The risks, benefits, complications, treatment options, and expected outcomes were discussed with the patient. The possibilities of reaction to medication, pulmonary aspiration, perforation of viscus, bleeding, recurrent infection, finding a normal gallbladder, the need for additional procedures, failure to diagnose a condition, the possible need to convert to an open procedure, and creating a complication requiring transfusion or operation were discussed with the patient. The patient and/or family concurred with the proposed plan, giving informed consent. The site of surgery properly noted/marked. The patient was taken to Operating Room, identified as Samantha Mcgee and the procedure verified as Laparoscopic Cholecystectomy with Intraoperative Cholangiograms. A Time Out was held and  the above information confirmed.  Prior to the induction of general anesthesia, antibiotic prophylaxis was administered. General endotracheal anesthesia was then administered and tolerated well. After the induction, the abdomen was prepped in the usual sterile fashion. The patient was positioned in the supine position with the left arm comfortably tucked, along with some reverse Trendelenburg.  Local anesthetic agent was injected into the skin near the umbilicus and an incision made. The midline fascia was incised and the Hasson technique was used to introduce a 12 mm port under direct vision. It was secured with a figure of eight Vicryl suture placed in the usual fashion. Pneumoperitoneum was then created with CO2 and tolerated well without any adverse changes in the patient's vital signs. Additional trocars were introduced under direct vision with an 11 mm trocar in the epigastrium and two  5 mm trocars in the right upper quadrant. All skin incisions were infiltrated with a local anesthetic agent before making the incision and placing the trocars.   The gallbladder was identified, the fundus grasped and retracted cephalad. Adhesions were lysed bluntly and with the electrocautery where indicated, taking care not to injure any adjacent organs or viscus. The infundibulum was grasped and retracted laterally, exposing the peritoneum overlying the triangle of Calot. This was then divided and exposed in a blunt fashion. The cystic duct was clearly identified and bluntly dissected circumferentially. The junctions of the gallbladder, cystic duct and common bile duct were clearly identified prior to the division of any linear structure.   An incision was made in the cystic duct and the cholangiogram catheter introduced. The catheter was secured using an endoclip. The study showed no stones and good visualization of the distal and proximal biliary tree. The catheter was then removed.   The cystic duct was then   ligated with surgical clips  on the patient side  and  clipped on the gallbladder side and divided. The cystic artery was identified, dissected free, ligated with clips and divided as well. Posterior cystic artery clipped and divided.  The gallbladder was dissected from the liver bed in retrograde fashion with the electrocautery. The gallbladder was removed. The liver bed was irrigated and inspected. Hemostasis was achieved with the electrocautery. Copious irrigation was utilized and was repeatedly aspirated until clear all particulate matter. Hemostasis was achieved with no signs of bleeding or bile leakage.  Pneumoperitoneum was completely reduced after viewing removal of the trocars under direct vision. The wound was thoroughly irrigated and the fascia was then closed with a figure of eight suture; the skin was then closed with 4-0  and a sterile dressing of Dermabond applied .  Instrument, sponge, and needle counts were correct at closure and at the conclusion of the case.   Findings: Cholecystitis   Estimated Blood Loss: less than 50 mL         Drains: none          Total IV Fluids: 800 mL         Specimens: Gallbladder           Complications: None; patient tolerated the procedure well.         Disposition: PACU - hemodynamically stable.         Condition: stable

## 2016-10-08 ENCOUNTER — Encounter (HOSPITAL_COMMUNITY): Payer: Self-pay | Admitting: Surgery

## 2016-10-26 ENCOUNTER — Other Ambulatory Visit: Payer: Self-pay | Admitting: *Deleted

## 2016-10-26 MED ORDER — MOMETASONE FUROATE 50 MCG/ACT NA SUSP: NASAL | 3 refills | Status: DC

## 2016-10-27 ENCOUNTER — Other Ambulatory Visit: Payer: Self-pay | Admitting: Allergy

## 2016-10-27 ENCOUNTER — Telehealth: Payer: Self-pay

## 2016-10-27 MED ORDER — CICLESONIDE 37 MCG/ACT NA AERS: INHALATION_SPRAY | NASAL | 2 refills | Status: DC

## 2016-10-27 NOTE — Telephone Encounter (Signed)
Dr. Ernst Bowler, Insurance no longer paying for Nasonex.  Preferred drugs are fluticasone, flunisolide, and Zetonna. Please advise. Patient does need an OV June 2018 per notes.

## 2016-10-27 NOTE — Telephone Encounter (Signed)
Let's give Samantha Mcgee a shot - one spray per nostril once daily. I believe that she has failed the other nasal steroids if I am not mistaken.   Salvatore Marvel, MD [facility]

## 2016-10-27 NOTE — Telephone Encounter (Signed)
Called in [place].

## 2016-10-29 ENCOUNTER — Other Ambulatory Visit: Payer: Self-pay

## 2016-10-29 MED ORDER — FLUTICASONE PROPIONATE 50 MCG/ACT NA SUSP: NASAL | 5 refills | Status: DC

## 2016-10-29 NOTE — Telephone Encounter (Signed)
New rx sent to [facility] x 5, OK per Dr. Ernst Bowler to switch from Nasonex to fluticasone

## 2016-11-06 ENCOUNTER — Other Ambulatory Visit: Payer: Self-pay | Admitting: Allergy & Immunology

## 2016-11-11 ENCOUNTER — Other Ambulatory Visit: Payer: Self-pay

## 2016-11-11 NOTE — Telephone Encounter (Signed)
Received fax from [facility] in regards to a request from the patient for a prescription for Linzess. I denied that prescription. Dr. Ernst Bowler has not prescribed this medication. Sent [facility] back to [facility].

## 2016-12-13 NOTE — Addendum Note (Signed)
Addendum  created 12/13/16 1248 by Myrtie Soman, MD   Sign clinical note

## 2016-12-20 ENCOUNTER — Ambulatory Visit: Payer: 59 | Admitting: Allergy & Immunology

## 2016-12-27 ENCOUNTER — Other Ambulatory Visit: Payer: Self-pay | Admitting: Allergy & Immunology

## 2017-01-08 ENCOUNTER — Other Ambulatory Visit: Payer: Self-pay | Admitting: Allergy & Immunology

## 2017-01-26 ENCOUNTER — Other Ambulatory Visit: Payer: Self-pay | Admitting: Allergy & Immunology

## 2017-01-26 ENCOUNTER — Other Ambulatory Visit: Payer: Self-pay | Admitting: Allergy

## 2017-02-25 DIAGNOSIS — L709 Acne, unspecified: Secondary | ICD-10-CM | POA: Diagnosis not present

## 2017-02-25 DIAGNOSIS — L299 Pruritus, unspecified: Secondary | ICD-10-CM | POA: Diagnosis not present

## 2017-02-25 DIAGNOSIS — G4700 Insomnia, unspecified: Secondary | ICD-10-CM | POA: Diagnosis not present

## 2017-03-01 DIAGNOSIS — L700 Acne vulgaris: Secondary | ICD-10-CM | POA: Diagnosis not present

## 2017-03-23 ENCOUNTER — Other Ambulatory Visit: Payer: Self-pay | Admitting: Allergy & Immunology

## 2017-03-31 ENCOUNTER — Ambulatory Visit (INDEPENDENT_AMBULATORY_CARE_PROVIDER_SITE_OTHER): Payer: 59 | Admitting: Allergy

## 2017-03-31 ENCOUNTER — Encounter: Payer: Self-pay | Admitting: Allergy

## 2017-03-31 ENCOUNTER — Other Ambulatory Visit: Payer: Self-pay | Admitting: *Deleted

## 2017-03-31 VITALS — BP 114/76 | HR 64 | Resp 16 | Ht 63.5 in | Wt 195.6 lb

## 2017-03-31 DIAGNOSIS — J4530 Mild persistent asthma, uncomplicated: Secondary | ICD-10-CM | POA: Diagnosis not present

## 2017-03-31 DIAGNOSIS — J3089 Other allergic rhinitis: Secondary | ICD-10-CM

## 2017-03-31 MED ORDER — MONTELUKAST SODIUM 10 MG PO TABS: 10.0000 mg | ORAL_TABLET | Freq: Every day | ORAL | 1 refills | Status: DC

## 2017-03-31 MED ORDER — ALBUTEROL SULFATE HFA 108 (90 BASE) MCG/ACT IN AERS: INHALATION_SPRAY | RESPIRATORY_TRACT | 1 refills | Status: DC

## 2017-03-31 MED ORDER — CETIRIZINE HCL 10 MG PO TABS: ORAL_TABLET | ORAL | 1 refills | Status: AC

## 2017-03-31 MED ORDER — MOMETASONE FUROATE 220 MCG/INH IN AEPB: 1.0000 | INHALATION_SPRAY | Freq: Two times a day (BID) | RESPIRATORY_TRACT | 1 refills | Status: DC

## 2017-03-31 NOTE — Patient Instructions (Addendum)
1. Mild persistent asthma - Lung function looked stable today. - Continue Asmanex twice daily; can use three times during asthma flares or respiratory illnesses - Continue with Singulair 10mg  daily. - Use ProAir 4 puffs every 4-6 hours as needed for coughing/wheezing.  2. Perennial allergic rhinitis - Continue with cetirizine 10mg  daily.  - Continue with Nasonex 2 sprays per nostril daily.  3. Return in about 6 months.  Please inform us of any Emergency Department visits, hospitalizations, or changes in symptoms. Call us before going to the ED for breathing or allergy symptoms since we might be able to fit you in for a sick visit. Feel free to contact us anytime with any questions, problems, or concerns.  It was a pleasure to meet you today!  Websites that have reliable patient information: 1. American Academy of Asthma, Allergy, and Immunology: www.aaaai.org 2. Food Allergy Research and Education (FARE): foodallergy.org 3. Mothers of Asthmatics: http://www.asthmacommunitynetwork.org 4. American College of Allergy, Asthma, and Immunology: www.acaai.org

## 2017-03-31 NOTE — Progress Notes (Signed)
Follow-up Note  RE: Samantha Mcgee MRN: [id number] DOB: [birth year] Date of Office Visit: 03/31/2017   History of present illness: Samantha Mcgee is a 51 y.o. female presenting today for follow-up of her mild persistent asthma and allergic rhinitis. She was last seen here on 06/24/16 by Dr. Ernst Bowler. She reports feeling well in the interim. She is currently taking Asmanex 200 mcg 1 puff twice daily, Singulair 10 mg daily, and Albuterol as needed for her asthma. She says she usually only needs to use her rescue inhaler once a month, cannot recall the last use. She says she recently changed jobs which requires her to walk up 3 flights of stairs which causes her to feel short of breath. She has not had or used her rescue inhaler during those episodes. She does use her inhaler prophylactically prior to exercise which prevents asthma exacerbations. She is taking Zyrtec 10 mg daily for her allergic rhinitis. She has not required nay ED/UC visits or oral steroids in past year nor nighttime awakenings.  She has had recent mild clear rhinorrhea with the weather change. She says otherwise her symptoms are currently well controlled without any recent dyspnea, wheezing, cough, sore throat, fevers, chills, diaphoresis, or rash.   Review of systems: Review of Systems  Constitutional: Negative for chills, fever and malaise/fatigue.  HENT: Positive for congestion. Negative for ear discharge, ear pain, nosebleeds, sinus pain, sore throat and tinnitus.   Eyes: Negative for pain, discharge and redness.  Respiratory: Negative for cough, shortness of breath and wheezing.   Cardiovascular: Negative for chest pain.  Gastrointestinal: Negative for abdominal pain, constipation, diarrhea, nausea and vomiting.  Skin: Negative for itching and rash.  Neurological: Negative for headaches.    All other systems negative unless noted above in HPI  Past medical/social/surgical/family history have been reviewed and  are unchanged unless specifically indicated below.  No changes  Medication List: Allergies as of 03/31/2017      Reactions   Latex Other (See Comments)   Blisters.      Medication List       Accurate as of 03/31/17  4:01 PM. Always use your most recent med list.          albuterol 108 (90 Base) MCG/ACT inhaler Commonly known as:  PROAIR HFA Use 2 puffs every 4 hours as needed for cough or wheeze.  May use 2 puffs 10-20 minutes prior to exercise.   amitriptyline 10 MG tablet Commonly known as:  ELAVIL Take 20 mg by mouth at bedtime.   ampicillin 500 MG capsule Commonly known as:  PRINCIPEN ampicillin 500 mg capsule  TAKE 1 CAPSULE BY MOUTH TWICE A DAY   CAL MAG ZINC +D3 PO Take 1 tablet by mouth daily.   cetirizine 10 MG tablet Commonly known as:  ZYRTEC TAKE 1 TABLET BY MOUTH EVERY DAY FOR RUNNY NOSE OR ITCHING   Ciclesonide 37 MCG/ACT Aers Commonly known as:  ZETONNA One spray per  Nostril once daily   Clindamycin-Benzoyl Per (Refr) gel clindamycin 1.2 % (1 % base)-benzoyl peroxide 5 % topical gel  APPLY ON FACE ONCE DAILY IN THE MORNING   Dapsone 5 % topical gel Apply 1 application topically 2 (two) times daily. Applied to face   linaclotide 145 MCG Caps capsule Commonly known as:  LINZESS Take 145 mcg by mouth daily before breakfast.   mometasone 220 MCG/INH inhaler Commonly known as:  ASMANEX 120 METERED DOSES Inhale one dose once daily to prevent cough or  wheeze. Inhale two doses twice daily during flare-up.  Rinse, gargle, and spit after use.   montelukast 10 MG tablet Commonly known as:  SINGULAIR TAKE 1 TABLET EVERY EVENING TO PREVENT COUGH/WHEEZE   pentosan polysulfate 100 MG capsule Commonly known as:  ELMIRON Take 200 mg by mouth at bedtime.   PROBIOTIC PO Take 2 capsules by mouth daily. ULTRA FLORA   ROC RETINOL CORREXION SPF30 EX tretinoin 0.05 % topical cream  APPLY A PEA SIZE AMOUNT TO FACE AT AT BEDTIME   SYSTANE 0.4-0.3 %  Soln Generic drug:  Polyethyl Glycol-Propyl Glycol Place 1 drop into both eyes 3 (three) times daily as needed (for dry eyes).   Turmeric 500 MG Caps Take 500 mg by mouth daily.   vitamin B-12 1000 MCG tablet Commonly known as:  CYANOCOBALAMIN Take 1,000 mcg by mouth daily.   zolpidem 10 MG tablet Commonly known as:  AMBIEN       Known medication allergies: Allergies  Allergen Reactions  . Latex Other (See Comments)    Blisters.     Physical examination: Blood pressure 114/76, pulse 64, resp. rate 16, height 5' 3.5" (1.613 m), weight 195 lb 9.6 oz (88.7 kg).  General: Alert, interactive, in no acute distress. HEENT: TMs pearly gray, turbinates non-edematous without discharge, post-pharynx non erythematous. Neck: Supple without lymphadenopathy. Lungs: Clear to auscultation without wheezing, rhonchi or rales. {no increased work of breathing. CV: Normal S1, S2 without murmurs. Abdomen: Nondistended, nontender. Skin: Warm and dry, without lesions or rashes. Extremities:  No clubbing, cyanosis or edema. Neuro:   Grossly intact.  Diagnositics/Labs: Labs: None obtained this visit.  Spirometry: FEV1: 81%, FVC: 73%, ratio consistent with non-obstructive pattern  Allergy testing: Not obtained this visit.   Assessment and plan:   1. Mild persistent asthma - Lung function looked stable today. - Continue Asmanex twice daily; can use three times during asthma flares or respiratory illnesses - Continue with Singulair 10mg  daily. - Use ProAir 4 puffs every 4-6 hours as needed for coughing/wheezing.  2. Perennial allergic rhinitis - Continue with cetirizine 10mg  daily.  - Continue with Nasonex 2 sprays per nostril daily.  3. Return in about 6 months.   Zada Finders, MD Internal Medicine PGY-3

## 2017-04-13 DIAGNOSIS — Z23 Encounter for immunization: Secondary | ICD-10-CM | POA: Diagnosis not present

## 2017-04-14 DIAGNOSIS — N3010 Interstitial cystitis (chronic) without hematuria: Secondary | ICD-10-CM | POA: Diagnosis not present

## 2017-05-04 DIAGNOSIS — Z0000 Encounter for general adult medical examination without abnormal findings: Secondary | ICD-10-CM | POA: Diagnosis not present

## 2017-05-04 DIAGNOSIS — E039 Hypothyroidism, unspecified: Secondary | ICD-10-CM | POA: Diagnosis not present

## 2017-05-13 ENCOUNTER — Other Ambulatory Visit: Payer: Self-pay | Admitting: Internal Medicine

## 2017-05-13 DIAGNOSIS — E049 Nontoxic goiter, unspecified: Secondary | ICD-10-CM

## 2017-05-16 ENCOUNTER — Other Ambulatory Visit: Payer: Self-pay

## 2017-05-16 DIAGNOSIS — J4530 Mild persistent asthma, uncomplicated: Secondary | ICD-10-CM

## 2017-05-16 MED ORDER — MOMETASONE FUROATE 220 MCG/INH IN AEPB: 1.0000 | INHALATION_SPRAY | Freq: Two times a day (BID) | RESPIRATORY_TRACT | 0 refills | Status: DC

## 2017-05-23 ENCOUNTER — Ambulatory Visit
Admission: RE | Admit: 2017-05-23 | Discharge: 2017-05-23 | Disposition: A | Discharge status: Home / Self Care | Payer: 59 | Source: Ambulatory Visit | Attending: Internal Medicine | Admitting: Internal Medicine

## 2017-05-23 DIAGNOSIS — E049 Nontoxic goiter, unspecified: Secondary | ICD-10-CM | POA: Diagnosis not present

## 2017-05-24 DIAGNOSIS — Z01419 Encounter for gynecological examination (general) (routine) without abnormal findings: Secondary | ICD-10-CM | POA: Diagnosis not present

## 2017-06-23 DIAGNOSIS — L821 Other seborrheic keratosis: Secondary | ICD-10-CM | POA: Diagnosis not present

## 2017-06-23 DIAGNOSIS — L700 Acne vulgaris: Secondary | ICD-10-CM | POA: Diagnosis not present

## 2017-06-23 DIAGNOSIS — L918 Other hypertrophic disorders of the skin: Secondary | ICD-10-CM | POA: Diagnosis not present

## 2017-07-08 ENCOUNTER — Other Ambulatory Visit: Payer: Self-pay | Admitting: Obstetrics and Gynecology

## 2017-07-08 DIAGNOSIS — Z1231 Encounter for screening mammogram for malignant neoplasm of breast: Secondary | ICD-10-CM

## 2017-07-26 ENCOUNTER — Other Ambulatory Visit: Payer: Self-pay | Admitting: Allergy & Immunology

## 2017-07-28 DIAGNOSIS — Z1211 Encounter for screening for malignant neoplasm of colon: Secondary | ICD-10-CM | POA: Diagnosis not present

## 2017-07-28 DIAGNOSIS — K219 Gastro-esophageal reflux disease without esophagitis: Secondary | ICD-10-CM | POA: Diagnosis not present

## 2017-07-28 DIAGNOSIS — K5904 Chronic idiopathic constipation: Secondary | ICD-10-CM | POA: Diagnosis not present

## 2017-08-01 ENCOUNTER — Ambulatory Visit
Admission: RE | Admit: 2017-08-01 | Discharge: 2017-08-01 | Disposition: A | Discharge status: Home / Self Care | Payer: 59 | Source: Ambulatory Visit | Attending: Obstetrics and Gynecology | Admitting: Obstetrics and Gynecology

## 2017-08-01 DIAGNOSIS — Z1231 Encounter for screening mammogram for malignant neoplasm of breast: Secondary | ICD-10-CM | POA: Diagnosis not present

## 2017-08-03 ENCOUNTER — Other Ambulatory Visit: Payer: Self-pay | Admitting: Obstetrics and Gynecology

## 2017-08-03 DIAGNOSIS — R928 Other abnormal and inconclusive findings on diagnostic imaging of breast: Secondary | ICD-10-CM

## 2017-08-12 ENCOUNTER — Ambulatory Visit
Admission: RE | Admit: 2017-08-12 | Discharge: 2017-08-12 | Disposition: A | Discharge status: Home / Self Care | Payer: 59 | Source: Ambulatory Visit | Attending: Obstetrics and Gynecology | Admitting: Obstetrics and Gynecology

## 2017-08-12 ENCOUNTER — Ambulatory Visit: Payer: 59

## 2017-08-12 DIAGNOSIS — R922 Inconclusive mammogram: Secondary | ICD-10-CM | POA: Diagnosis not present

## 2017-08-12 DIAGNOSIS — R928 Other abnormal and inconclusive findings on diagnostic imaging of breast: Secondary | ICD-10-CM

## 2017-09-16 DIAGNOSIS — Z1211 Encounter for screening for malignant neoplasm of colon: Secondary | ICD-10-CM | POA: Diagnosis not present

## 2017-09-16 DIAGNOSIS — K635 Polyp of colon: Secondary | ICD-10-CM | POA: Diagnosis not present

## 2017-09-16 DIAGNOSIS — D120 Benign neoplasm of cecum: Secondary | ICD-10-CM | POA: Diagnosis not present

## 2017-09-16 DIAGNOSIS — D122 Benign neoplasm of ascending colon: Secondary | ICD-10-CM | POA: Diagnosis not present

## 2017-09-26 ENCOUNTER — Other Ambulatory Visit: Payer: Self-pay | Admitting: Allergy

## 2017-09-26 DIAGNOSIS — J3089 Other allergic rhinitis: Secondary | ICD-10-CM

## 2017-09-26 DIAGNOSIS — J4530 Mild persistent asthma, uncomplicated: Secondary | ICD-10-CM

## 2017-09-30 ENCOUNTER — Telehealth: Payer: Self-pay | Admitting: Allergy

## 2017-09-30 ENCOUNTER — Other Ambulatory Visit: Payer: Self-pay

## 2017-09-30 DIAGNOSIS — J3089 Other allergic rhinitis: Secondary | ICD-10-CM

## 2017-09-30 DIAGNOSIS — J4530 Mild persistent asthma, uncomplicated: Secondary | ICD-10-CM

## 2017-09-30 MED ORDER — MONTELUKAST SODIUM 10 MG PO TABS: 10.0000 mg | ORAL_TABLET | Freq: Every day | ORAL | 0 refills | Status: DC

## 2017-09-30 NOTE — Telephone Encounter (Signed)
Patient would like a refill on [facility]ULAIR [facility] [address] Patient has upcoming appt  10/20/2017 - 52mo f/u

## 2017-10-20 ENCOUNTER — Ambulatory Visit (INDEPENDENT_AMBULATORY_CARE_PROVIDER_SITE_OTHER): Payer: 59 | Admitting: Allergy

## 2017-10-20 ENCOUNTER — Encounter: Payer: Self-pay | Admitting: Allergy

## 2017-10-20 DIAGNOSIS — J3089 Other allergic rhinitis: Secondary | ICD-10-CM | POA: Diagnosis not present

## 2017-10-20 DIAGNOSIS — J4530 Mild persistent asthma, uncomplicated: Secondary | ICD-10-CM

## 2017-10-20 MED ORDER — ALBUTEROL SULFATE HFA 108 (90 BASE) MCG/ACT IN AERS: INHALATION_SPRAY | RESPIRATORY_TRACT | 1 refills | Status: DC

## 2017-10-20 MED ORDER — MOMETASONE FUROATE 220 MCG/INH IN AEPB: 1.0000 | INHALATION_SPRAY | Freq: Two times a day (BID) | RESPIRATORY_TRACT | 1 refills | Status: DC

## 2017-10-20 MED ORDER — MONTELUKAST SODIUM 10 MG PO TABS: 10.0000 mg | ORAL_TABLET | Freq: Every day | ORAL | 5 refills | Status: AC

## 2017-10-20 NOTE — Progress Notes (Signed)
Follow-up Note  RE: Samantha Mcgee MRN: [id number] DOB: [birth year] Date of Office Visit: 10/20/2017   History of present illness: Samantha Mcgee is a 52 y.o. female presenting today for follow-up of asthma and allergic rhinitis.  She was last seen in the office on 03/31/17 by myself.  Since this visit she has not had any major health changes, surgeries or hospitalizations.  With her asthma she is doing well.  She denies any flares up requiring ED/UC visit, steroids or hospitalization.  She states she does use albuterol prior to activity which she has been doing more regularly and states she lose 3lbs in a week with her new exercise regimen.  She is using Asmanex once a day as states unable to afford this medication quoting a $400 price.     With her allergies she does report runny nose and sore throat.  She has started to use what sounds like Flonase sensimist (she states is egg shaped and does not feel the spray) and does feel it is helping her nasal symptoms.  She also continues on cetirizine daily.     Review of systems: Review of Systems  Constitutional: Negative for fever, malaise/fatigue and weight loss.  HENT: Positive for congestion and sore throat. Negative for ear discharge, ear pain, nosebleeds and sinus pain.   Eyes: Negative for pain, discharge and redness.  Respiratory: Negative for cough, shortness of breath and wheezing.   Cardiovascular: Negative for chest pain.  Gastrointestinal: Negative for abdominal pain, constipation, diarrhea, heartburn, nausea and vomiting.  Musculoskeletal: Negative for joint pain.  Skin: Negative for itching and rash.  Neurological: Negative for headaches.    All other systems negative unless noted above in HPI  Past medical/social/surgical/family history have been reviewed and are unchanged unless specifically indicated below.  No changes  Medication List: Allergies as of 10/20/2017      Reactions   Latex Other (See Comments)   Blisters.      Medication List        Accurate as of 10/20/17  6:19 PM. Always use your most recent med list.          albuterol 108 (90 Base) MCG/ACT inhaler Commonly known as:  PROAIR HFA Use 2 puffs every 4 hours as needed for cough or wheeze.  May use 2 puffs 10-20 minutes prior to exercise.   amitriptyline 10 MG tablet Commonly known as:  ELAVIL Take 20 mg by mouth at bedtime.   CAL MAG ZINC +D3 PO Take 1 tablet by mouth daily.   cetirizine 10 MG tablet Commonly known as:  ZYRTEC TAKE 1 TABLET BY MOUTH EVERY DAY FOR RUNNY NOSE OR ITCHING   linaclotide 145 MCG Caps capsule Commonly known as:  LINZESS Take 145 mcg by mouth daily before breakfast.   mometasone 220 MCG/INH inhaler Commonly known as:  ASMANEX 120 METERED DOSES Inhale 1 puff into the lungs 2 (two) times daily.   montelukast 10 MG tablet Commonly known as:  SINGULAIR Take 1 tablet (10 mg total) by mouth at bedtime.   PROBIOTIC PO Take 2 capsules by mouth daily. ULTRA FLORA   ROC RETINOL CORREXION SPF30 EX tretinoin 0.05 % topical cream  APPLY A PEA SIZE AMOUNT TO FACE AT AT BEDTIME   SYSTANE 0.4-0.3 % Soln Generic drug:  Polyethyl Glycol-Propyl Glycol Place 1 drop into both eyes 3 (three) times daily as needed (for dry eyes).   Turmeric 500 MG Caps Take 500 mg by mouth daily.  vitamin B-12 1000 MCG tablet Commonly known as:  CYANOCOBALAMIN Take 1,000 mcg by mouth daily.   ZETONNA 37 MCG/ACT Aers Generic drug:  Ciclesonide USE ONE SPRAY PER NOSTRIL ONCE DAILY   zolpidem 10 MG tablet Commonly known as:  AMBIEN       Known medication allergies: Allergies  Allergen Reactions  . Latex Other (See Comments)    Blisters.     Physical examination: Blood pressure 118/78, pulse 80, temperature 98.5 F (36.9 C), temperature source Oral, resp. rate 18, height 5' 1.5" (1.562 m), weight 197 lb (89.4 kg), SpO2 97 %.  General: Alert, interactive, in no acute distress. HEENT: PERRLA, TMs  pearly gray, turbinates mildly edematous without discharge, post-pharynx non erythematous. Neck: Supple without lymphadenopathy. Lungs: Clear to auscultation without wheezing, rhonchi or rales. {no increased work of breathing. CV: Normal S1, S2 without murmurs. Abdomen: Nondistended, nontender. Skin: Warm and dry, without lesions or rashes. Extremities:  No clubbing, cyanosis or edema. Neuro:   Grossly intact.  Diagnositics/Labs:  Spirometry: FEV1: 1.94L  91%, FVC: 2.12L  80%, ratio consistent with nonobstructive pattern   Assessment and plan:   Mild persistent asthma - Lung function looks great today! - Continue Asmanex 220  2 inhalations twice daily (sample provided today)   Asthma action plan: can use 3 inhalations 3 times a day during asthma flares or respiratory illnesses - Continue with Singulair 10mg  daily. - Use ProAir 4 puffs every 4-6 hours as needed for coughing/wheezing.  Asthma control goals:   Full participation in all desired activities (may need albuterol before activity)  Albuterol use two time or less a week on average (not counting use with activity)  Cough interfering with sleep two time or less a month  Oral steroids no more than once a year  No hospitalizations   Perennial allergic rhinitis - Continue with cetirizine (zyrtec) 10mg  daily.  - Continue with your nasal spray 2 sprays per nostril 1-2 (flonase sensismist) times a day for nasal congestion/drainage.   Return in about 6 months or sooner if needed.   I appreciate the opportunity to take part in Samantha Mcgee's care. Please do not hesitate to contact me with questions.  Sincerely,   Prudy Feeler, MD Allergy/Immunology [facility] [place]

## 2017-10-20 NOTE — Patient Instructions (Addendum)
Mild persistent asthma - Lung function looks great today! - Continue Asmanex 2 inhalations twice daily  Asthma action plan: can use 3 inhalations 3 times a day during asthma flares or respiratory illnesses - Continue with Singulair 10mg  daily. - Use ProAir 4 puffs every 4-6 hours as needed for coughing/wheezing.  Asthma control goals:   Full participation in all desired activities (may need albuterol before activity)  Albuterol use two time or less a week on average (not counting use with activity)  Cough interfering with sleep two time or less a month  Oral steroids no more than once a year  No hospitalizations   Perennial allergic rhinitis - Continue with cetirizine (zyrtec) 10mg  daily.  - Continue with your nasal spray 2 sprays per nostril 1-2 times a day for nasal congestion/drainage.   Return in about 6 months or sooner if needed.

## 2017-10-29 ENCOUNTER — Other Ambulatory Visit: Payer: Self-pay | Admitting: Allergy

## 2017-10-29 DIAGNOSIS — J4530 Mild persistent asthma, uncomplicated: Secondary | ICD-10-CM

## 2017-10-29 DIAGNOSIS — J3089 Other allergic rhinitis: Secondary | ICD-10-CM

## 2017-12-12 DIAGNOSIS — N920 Excessive and frequent menstruation with regular cycle: Secondary | ICD-10-CM | POA: Diagnosis not present

## 2017-12-12 DIAGNOSIS — R102 Pelvic and perineal pain: Secondary | ICD-10-CM | POA: Diagnosis not present

## 2017-12-12 DIAGNOSIS — R350 Frequency of micturition: Secondary | ICD-10-CM | POA: Diagnosis not present

## 2018-03-07 ENCOUNTER — Other Ambulatory Visit: Payer: Self-pay | Admitting: Allergy

## 2018-03-07 DIAGNOSIS — J4530 Mild persistent asthma, uncomplicated: Secondary | ICD-10-CM

## 2018-03-07 DIAGNOSIS — J3089 Other allergic rhinitis: Secondary | ICD-10-CM

## 2018-03-16 ENCOUNTER — Other Ambulatory Visit: Payer: Self-pay | Admitting: Obstetrics and Gynecology

## 2018-03-23 NOTE — Patient Instructions (Addendum)
Samantha Mcgee  03/23/2018   Your procedure is scheduled on: 03-30-18   Report to [facility] Main  Entrance    Report to Admitting at 5:30 AM    Call this number if you have problems the morning of surgery [phone]     Remember: Do not eat food or drink liquids :After Midnight.     Take these medicines the morning of surgery with A SIP OF WATER: Cetirizine (Zyrtec)                                You may not have any metal on your body including hair pins and              piercings  Do not wear jewelry, make-up, lotions, powders or perfumes, deodorant             Do not wear nail polish.  Do not shave  48 hours prior to surgery.             Do not bring valuables to the hospital. [place].  Contacts, dentures or bridgework may not be worn into surgery.  Leave suitcase in the car. After surgery it may be brought to your room.     Special Instructions: N/A              Please read over the following fact sheets you were given: _____________________________________________________________________           [facility] - Preparing for Surgery Before surgery, you can play an important role.  Because skin is not sterile, your skin needs to be as free of germs as possible.  You can reduce the number of germs on your skin by washing with CHG (chlorahexidine gluconate) soap before surgery.  CHG is an antiseptic cleaner which kills germs and bonds with the skin to continue killing germs even after washing. Please DO NOT use if you have an allergy to CHG or antibacterial soaps.  If your skin becomes reddened/irritated stop using the CHG and inform your nurse when you arrive at Short Stay. Do not shave (including legs and underarms) for at least 48 hours prior to the first CHG shower.  You may shave your face/neck. Please follow these instructions carefully:  1.  Shower with CHG Soap the night before surgery  and the  morning of Surgery.  2.  If you choose to wash your hair, wash your hair first as usual with your  normal  shampoo.  3.  After you shampoo, rinse your hair and body thoroughly to remove the  shampoo.                           4.  Use CHG as you would any other liquid soap.  You can apply chg directly  to the skin and wash                       Gently with a scrungie or clean washcloth.  5.  Apply the CHG Soap to your body ONLY FROM THE NECK DOWN.   Do not use on face/ open  Wound or open sores. Avoid contact with eyes, ears mouth and genitals (private parts).                       Wash face,  Genitals (private parts) with your normal soap.             6.  Wash thoroughly, paying special attention to the area where your surgery  will be performed.  7.  Thoroughly rinse your body with warm water from the neck down.  8.  DO NOT shower/wash with your normal soap after using and rinsing off  the CHG Soap.                9.  Pat yourself dry with a clean towel.            10.  Wear clean pajamas.            11.  Place clean sheets on your bed the night of your first shower and do not  sleep with pets. Day of Surgery : Do not apply any lotions/deodorants the morning of surgery.  Please wear clean clothes to the hospital/surgery center.  FAILURE TO FOLLOW THESE INSTRUCTIONS MAY RESULT IN THE CANCELLATION OF YOUR SURGERY PATIENT SIGNATURE_________________________________  NURSE SIGNATURE__________________________________  ________________________________________________________________________

## 2018-03-24 ENCOUNTER — Encounter (HOSPITAL_COMMUNITY)
Admission: RE | Admit: 2018-03-24 | Discharge: 2018-03-24 | Disposition: A | Discharge status: Home / Self Care | Payer: 59 | Source: Ambulatory Visit | Attending: Obstetrics and Gynecology | Admitting: Obstetrics and Gynecology

## 2018-03-24 ENCOUNTER — Encounter (HOSPITAL_COMMUNITY): Payer: Self-pay

## 2018-03-24 ENCOUNTER — Other Ambulatory Visit: Payer: Self-pay | Admitting: Allergy

## 2018-03-24 ENCOUNTER — Other Ambulatory Visit: Payer: Self-pay

## 2018-03-24 DIAGNOSIS — D259 Leiomyoma of uterus, unspecified: Secondary | ICD-10-CM | POA: Insufficient documentation

## 2018-03-24 DIAGNOSIS — Z30432 Encounter for removal of intrauterine contraceptive device: Secondary | ICD-10-CM | POA: Diagnosis not present

## 2018-03-24 DIAGNOSIS — N920 Excessive and frequent menstruation with regular cycle: Secondary | ICD-10-CM | POA: Diagnosis not present

## 2018-03-24 DIAGNOSIS — Z01812 Encounter for preprocedural laboratory examination: Secondary | ICD-10-CM | POA: Diagnosis present

## 2018-03-24 DIAGNOSIS — J4530 Mild persistent asthma, uncomplicated: Secondary | ICD-10-CM

## 2018-03-24 LAB — BASIC METABOLIC PANEL
Anion gap: 8 (ref 5–15)
BUN: 12 mg/dL (ref 6–20)
CO2: 27 mmol/L (ref 22–32)
Calcium: 9.2 mg/dL (ref 8.9–10.3)
Chloride: 107 mmol/L (ref 98–111)
Creatinine, Ser: 0.67 mg/dL (ref 0.44–1.00)
GFR calc Af Amer: >60 mL/min (ref >60)
GFR calc non Af Amer: >60 mL/min (ref >60)
Glucose, Bld: 101 mg/dL — ABNORMAL HIGH (ref 70–99)
Potassium: 4.1 mmol/L (ref 3.5–5.1)
Sodium: 142 mmol/L (ref 135–145)

## 2018-03-24 LAB — CBC
HCT: 41.3 % (ref 36.0–46.0)
Hemoglobin: 13.7 g/dL (ref 12.0–15.0)
MCH: 29 pg (ref 26.0–34.0)
MCHC: 33.2 g/dL (ref 30.0–36.0)
MCV: 87.3 fL (ref 78.0–100.0)
Platelets: 285 10*3/uL (ref 150–400)
RBC: 4.73 MIL/uL (ref 3.87–5.11)
RDW: 13.6 % (ref 11.5–15.5)
WBC: 9.6 10*3/uL (ref 4.0–10.5)

## 2018-03-29 NOTE — Anesthesia Preprocedure Evaluation (Addendum)
Anesthesia Evaluation  Patient identified by MRN, date of birth, ID band Patient awake    Reviewed: Allergy & Precautions, NPO status , Patient's Chart, lab work & pertinent test results  History of Anesthesia Complications Negative for: history of anesthetic complications  Airway Mallampati: II  TM Distance: >3 FB Neck ROM: Full    Dental  (+) Dental Advisory Given   Pulmonary asthma (last inhaler needed a month ago) ,    breath sounds clear to auscultation       Cardiovascular negative cardio ROS   Rhythm:Regular Rate:Normal     Neuro/Psych Anxiety negative neurological ROS     GI/Hepatic negative GI ROS, Neg liver ROS,   Endo/Other  Morbid obesity  Renal/GU negative Renal ROS     Musculoskeletal  (+) Arthritis ,   Abdominal (+) + obese,   Peds  Hematology negative hematology ROS (+)   Anesthesia Other Findings   Reproductive/Obstetrics                            Anesthesia Physical Anesthesia Plan  ASA: II  Anesthesia Plan: General   Post-op Pain Management:    Induction: Intravenous  PONV Risk Score and Plan: 4 or greater and Scopolamine patch - Pre-op, Ondansetron and Dexamethasone  Airway Management Planned: Oral ETT  Additional Equipment:   Intra-op Plan:   Post-operative Plan: Extubation in OR  Informed Consent: I have reviewed the patients History and Physical, chart, labs and discussed the procedure including the risks, benefits and alternatives for the proposed anesthesia with the patient or authorized representative who has indicated his/her understanding and acceptance.   Dental advisory given  Plan Discussed with: CRNA and Surgeon  Anesthesia Plan Comments: (Plan routine monitors, GETA)       Anesthesia Quick Evaluation

## 2018-03-30 ENCOUNTER — Ambulatory Visit (HOSPITAL_COMMUNITY): Payer: 59 | Admitting: Anesthesiology

## 2018-03-30 ENCOUNTER — Ambulatory Visit (HOSPITAL_COMMUNITY)
Admission: RE | Admit: 2018-03-30 | Discharge: 2018-03-31 | Disposition: A | Discharge status: Home / Self Care | Payer: 59 | Source: Ambulatory Visit | Attending: Obstetrics and Gynecology | Admitting: Obstetrics and Gynecology

## 2018-03-30 ENCOUNTER — Encounter (HOSPITAL_COMMUNITY)
Admission: RE | Disposition: A | Discharge status: Home / Self Care | Payer: Self-pay | Source: Ambulatory Visit | Attending: Obstetrics and Gynecology

## 2018-03-30 ENCOUNTER — Encounter (HOSPITAL_COMMUNITY): Payer: Self-pay

## 2018-03-30 DIAGNOSIS — Z90710 Acquired absence of both cervix and uterus: Secondary | ICD-10-CM | POA: Diagnosis present

## 2018-03-30 DIAGNOSIS — J45909 Unspecified asthma, uncomplicated: Secondary | ICD-10-CM | POA: Insufficient documentation

## 2018-03-30 DIAGNOSIS — Z7951 Long term (current) use of inhaled steroids: Secondary | ICD-10-CM | POA: Insufficient documentation

## 2018-03-30 DIAGNOSIS — N938 Other specified abnormal uterine and vaginal bleeding: Secondary | ICD-10-CM | POA: Diagnosis not present

## 2018-03-30 DIAGNOSIS — N72 Inflammatory disease of cervix uteri: Secondary | ICD-10-CM | POA: Insufficient documentation

## 2018-03-30 DIAGNOSIS — D259 Leiomyoma of uterus, unspecified: Secondary | ICD-10-CM | POA: Insufficient documentation

## 2018-03-30 DIAGNOSIS — N800 Endometriosis of uterus: Secondary | ICD-10-CM | POA: Insufficient documentation

## 2018-03-30 DIAGNOSIS — Z6831 Body mass index (BMI) 31.0-31.9, adult: Secondary | ICD-10-CM | POA: Insufficient documentation

## 2018-03-30 DIAGNOSIS — N803 Endometriosis of pelvic peritoneum: Secondary | ICD-10-CM | POA: Diagnosis not present

## 2018-03-30 DIAGNOSIS — Z79899 Other long term (current) drug therapy: Secondary | ICD-10-CM | POA: Diagnosis not present

## 2018-03-30 DIAGNOSIS — R102 Pelvic and perineal pain: Secondary | ICD-10-CM | POA: Diagnosis present

## 2018-03-30 DIAGNOSIS — D251 Intramural leiomyoma of uterus: Secondary | ICD-10-CM | POA: Diagnosis not present

## 2018-03-30 HISTORY — PX: CYSTOSCOPY: SHX5120

## 2018-03-30 HISTORY — PX: ROBOTIC ASSISTED LAPAROSCOPIC HYSTERECTOMY AND SALPINGECTOMY: SHX6379

## 2018-03-30 LAB — PREGNANCY, URINE: Preg Test, Ur: NEGATIVE

## 2018-03-30 SURGERY — XI ROBOTIC ASSISTED LAPAROSCOPIC HYSTERECTOMY AND SALPINGECTOMY
Anesthesia: General

## 2018-03-30 MED ORDER — PROMETHAZINE HCL 25 MG/ML IJ SOLN: 6.2500 mg | INTRAMUSCULAR | Status: DC | PRN

## 2018-03-30 MED ORDER — IBUPROFEN 200 MG PO TABS: 800.0000 mg | ORAL_TABLET | Freq: Three times a day (TID) | ORAL | Status: DC | PRN

## 2018-03-30 MED ORDER — SUGAMMADEX SODIUM 200 MG/2ML IV SOLN
INTRAVENOUS | Status: DC | PRN
  Administered 2018-03-30: 200 mg via INTRAVENOUS

## 2018-03-30 MED ORDER — DEXAMETHASONE SODIUM PHOSPHATE 10 MG/ML IJ SOLN
INTRAMUSCULAR | Status: AC
  Filled 2018-03-30: qty 1

## 2018-03-30 MED ORDER — BUPIVACAINE HCL (PF) 0.25 % IJ SOLN
INTRAMUSCULAR | Status: AC
  Filled 2018-03-30: qty 30

## 2018-03-30 MED ORDER — KETOROLAC TROMETHAMINE 30 MG/ML IJ SOLN
INTRAMUSCULAR | Status: DC | PRN
  Administered 2018-03-30: 30 mg via INTRAVENOUS

## 2018-03-30 MED ORDER — LIDOCAINE HCL (CARDIAC) PF 100 MG/5ML IV SOSY
PREFILLED_SYRINGE | INTRAVENOUS | Status: DC | PRN
  Administered 2018-03-30: 100 mg via INTRAVENOUS

## 2018-03-30 MED ORDER — PROPOFOL 10 MG/ML IV BOLUS
INTRAVENOUS | Status: AC
  Filled 2018-03-30: qty 20

## 2018-03-30 MED ORDER — ONDANSETRON HCL 4 MG/2ML IJ SOLN
INTRAMUSCULAR | Status: AC
  Filled 2018-03-30: qty 2

## 2018-03-30 MED ORDER — KETOROLAC TROMETHAMINE 30 MG/ML IJ SOLN: 30.0000 mg | Freq: Four times a day (QID) | INTRAMUSCULAR | Status: DC

## 2018-03-30 MED ORDER — HYDROMORPHONE HCL 1 MG/ML IJ SOLN
0.2000 mg | INTRAMUSCULAR | Status: DC | PRN
  Administered 2018-03-30 (×2): 0.2 mg via INTRAVENOUS

## 2018-03-30 MED ORDER — LINACLOTIDE 145 MCG PO CAPS
145.0000 ug | ORAL_CAPSULE | Freq: Every day | ORAL | Status: DC
  Administered 2018-03-31: 145 ug via ORAL
  Filled 2018-03-30: qty 1

## 2018-03-30 MED ORDER — ONDANSETRON HCL 4 MG PO TABS: 4.0000 mg | ORAL_TABLET | Freq: Four times a day (QID) | ORAL | Status: DC | PRN

## 2018-03-30 MED ORDER — SIMETHICONE 80 MG PO CHEW
80.0000 mg | CHEWABLE_TABLET | Freq: Four times a day (QID) | ORAL | Status: DC | PRN
  Administered 2018-03-31: 80 mg via ORAL

## 2018-03-30 MED ORDER — PANTOPRAZOLE SODIUM 40 MG PO TBEC
40.0000 mg | DELAYED_RELEASE_TABLET | Freq: Every day | ORAL | Status: DC
  Administered 2018-03-30: 40 mg via ORAL
  Filled 2018-03-30: qty 1

## 2018-03-30 MED ORDER — HYDROMORPHONE HCL 1 MG/ML IJ SOLN
INTRAMUSCULAR | Status: AC
  Filled 2018-03-30: qty 1

## 2018-03-30 MED ORDER — FENTANYL CITRATE (PF) 100 MCG/2ML IJ SOLN
INTRAMUSCULAR | Status: DC | PRN
  Administered 2018-03-30: 100 ug via INTRAVENOUS
  Administered 2018-03-30: 50 ug via INTRAVENOUS
  Administered 2018-03-30 (×2): 100 ug via INTRAVENOUS

## 2018-03-30 MED ORDER — HYDROMORPHONE HCL 2 MG/ML IJ SOLN
INTRAMUSCULAR | Status: AC
  Filled 2018-03-30: qty 1

## 2018-03-30 MED ORDER — SODIUM CHLORIDE 0.9 % IR SOLN
Status: DC | PRN
  Administered 2018-03-30: 3000 mL

## 2018-03-30 MED ORDER — ONDANSETRON HCL 4 MG/2ML IJ SOLN
INTRAMUSCULAR | Status: DC | PRN
  Administered 2018-03-30: 4 mg via INTRAVENOUS

## 2018-03-30 MED ORDER — KETOROLAC TROMETHAMINE 30 MG/ML IJ SOLN
INTRAMUSCULAR | Status: AC
  Filled 2018-03-30: qty 1

## 2018-03-30 MED ORDER — DEXTROSE IN LACTATED RINGERS 5 % IV SOLN
INTRAVENOUS | Status: DC
  Administered 2018-03-30 (×2): via INTRAVENOUS

## 2018-03-30 MED ORDER — LIDOCAINE 2% (20 MG/ML) 5 ML SYRINGE
INTRAMUSCULAR | Status: AC
  Filled 2018-03-30: qty 5

## 2018-03-30 MED ORDER — PROMETHAZINE HCL 25 MG PO TABS
ORAL_TABLET | ORAL | Status: AC
  Filled 2018-03-30: qty 1

## 2018-03-30 MED ORDER — PROMETHAZINE HCL 25 MG PO TABS
25.0000 mg | ORAL_TABLET | Freq: Four times a day (QID) | ORAL | Status: DC | PRN
  Administered 2018-03-30 – 2018-03-31 (×2): 25 mg via ORAL
  Filled 2018-03-30: qty 1

## 2018-03-30 MED ORDER — ROCURONIUM BROMIDE 100 MG/10ML IV SOLN
INTRAVENOUS | Status: DC | PRN
  Administered 2018-03-30: 50 mg via INTRAVENOUS
  Administered 2018-03-30: 10 mg via INTRAVENOUS
  Administered 2018-03-30 (×2): 20 mg via INTRAVENOUS

## 2018-03-30 MED ORDER — CEFAZOLIN SODIUM-DEXTROSE 2-4 GM/100ML-% IV SOLN
2.0000 g | INTRAVENOUS | Status: AC
  Administered 2018-03-30: 2 g via INTRAVENOUS
  Filled 2018-03-30: qty 100

## 2018-03-30 MED ORDER — MENTHOL 3 MG MT LOZG: 1.0000 | LOZENGE | OROMUCOSAL | Status: DC | PRN

## 2018-03-30 MED ORDER — ONDANSETRON HCL 4 MG/2ML IJ SOLN
4.0000 mg | Freq: Four times a day (QID) | INTRAMUSCULAR | Status: DC | PRN
  Administered 2018-03-30: 4 mg via INTRAVENOUS

## 2018-03-30 MED ORDER — PROPOFOL 10 MG/ML IV BOLUS
INTRAVENOUS | Status: DC | PRN
  Administered 2018-03-30: 50 mg via INTRAVENOUS
  Administered 2018-03-30: 150 mg via INTRAVENOUS

## 2018-03-30 MED ORDER — HYDROMORPHONE HCL 1 MG/ML IJ SOLN: 0.2500 mg | INTRAMUSCULAR | Status: DC | PRN

## 2018-03-30 MED ORDER — ALBUTEROL SULFATE HFA 108 (90 BASE) MCG/ACT IN AERS: 2.0000 | INHALATION_SPRAY | RESPIRATORY_TRACT | Status: DC

## 2018-03-30 MED ORDER — BUPIVACAINE HCL (PF) 0.25 % IJ SOLN
INTRAMUSCULAR | Status: DC | PRN
  Administered 2018-03-30: 10 mL

## 2018-03-30 MED ORDER — MIDAZOLAM HCL 2 MG/2ML IJ SOLN: 0.5000 mg | Freq: Once | INTRAMUSCULAR | Status: DC | PRN

## 2018-03-30 MED ORDER — MEPERIDINE HCL 50 MG/ML IJ SOLN
INTRAMUSCULAR | Status: AC
  Filled 2018-03-30: qty 1

## 2018-03-30 MED ORDER — MIDAZOLAM HCL 5 MG/5ML IJ SOLN
INTRAMUSCULAR | Status: DC | PRN
  Administered 2018-03-30: 2 mg via INTRAVENOUS

## 2018-03-30 MED ORDER — PENTOSAN POLYSULFATE SODIUM 100 MG PO CAPS
100.0000 mg | ORAL_CAPSULE | Freq: Two times a day (BID) | ORAL | Status: DC
  Administered 2018-03-30 – 2018-03-31 (×3): 100 mg via ORAL
  Filled 2018-03-30 (×3): qty 1

## 2018-03-30 MED ORDER — KETOROLAC TROMETHAMINE 30 MG/ML IJ SOLN
30.0000 mg | Freq: Four times a day (QID) | INTRAMUSCULAR | Status: DC
  Administered 2018-03-30 – 2018-03-31 (×3): 30 mg via INTRAVENOUS

## 2018-03-30 MED ORDER — FENTANYL CITRATE (PF) 250 MCG/5ML IJ SOLN
INTRAMUSCULAR | Status: AC
  Filled 2018-03-30: qty 5

## 2018-03-30 MED ORDER — FENTANYL CITRATE (PF) 100 MCG/2ML IJ SOLN
INTRAMUSCULAR | Status: AC
  Filled 2018-03-30: qty 2

## 2018-03-30 MED ORDER — ROPIVACAINE HCL 5 MG/ML IJ SOLN
INTRAMUSCULAR | Status: AC
  Filled 2018-03-30: qty 30

## 2018-03-30 MED ORDER — ROCURONIUM BROMIDE 10 MG/ML (PF) SYRINGE
PREFILLED_SYRINGE | INTRAVENOUS | Status: AC
  Filled 2018-03-30: qty 10

## 2018-03-30 MED ORDER — MIDAZOLAM HCL 2 MG/2ML IJ SOLN
INTRAMUSCULAR | Status: AC
  Filled 2018-03-30: qty 2

## 2018-03-30 MED ORDER — LACTATED RINGERS IV SOLN
INTRAVENOUS | Status: DC
  Administered 2018-03-30 (×2): via INTRAVENOUS

## 2018-03-30 MED ORDER — MONTELUKAST SODIUM 10 MG PO TABS
10.0000 mg | ORAL_TABLET | Freq: Every day | ORAL | Status: DC
  Administered 2018-03-30: 10 mg via ORAL
  Filled 2018-03-30: qty 1

## 2018-03-30 MED ORDER — ARTIFICIAL TEARS OPHTHALMIC OINT
TOPICAL_OINTMENT | OPHTHALMIC | Status: AC
  Filled 2018-03-30: qty 3.5

## 2018-03-30 MED ORDER — SODIUM CHLORIDE 0.9 % IJ SOLN
INTRAMUSCULAR | Status: AC
  Filled 2018-03-30: qty 50

## 2018-03-30 MED ORDER — MEPERIDINE HCL 50 MG/ML IJ SOLN
6.2500 mg | INTRAMUSCULAR | Status: DC | PRN
  Administered 2018-03-30: 12.5 mg via INTRAVENOUS

## 2018-03-30 MED ORDER — HYDROMORPHONE HCL 1 MG/ML IJ SOLN
INTRAMUSCULAR | Status: DC | PRN
  Administered 2018-03-30: 2 mg via INTRAVENOUS

## 2018-03-30 MED ORDER — SUGAMMADEX SODIUM 200 MG/2ML IV SOLN
INTRAVENOUS | Status: AC
  Filled 2018-03-30: qty 2

## 2018-03-30 MED ORDER — DEXAMETHASONE SODIUM PHOSPHATE 10 MG/ML IJ SOLN
INTRAMUSCULAR | Status: DC | PRN
  Administered 2018-03-30: 8 mg via INTRAVENOUS

## 2018-03-30 SURGICAL SUPPLY — 59 items
ADH SKN CLS APL DERMABOND .7 (GAUZE/BANDAGES/DRESSINGS) ×2
APL SRG 38 LTWT LNG FL B (MISCELLANEOUS) ×2
APPLICATOR ARISTA FLEXITIP XL (MISCELLANEOUS) ×1 IMPLANT
BARRIER ADHS 3X4 INTERCEED (GAUZE/BANDAGES/DRESSINGS) IMPLANT
BRR ADH 4X3 ABS CNTRL BYND (GAUZE/BANDAGES/DRESSINGS)
CANISTER SUCT 3000ML PPV (MISCELLANEOUS) ×3 IMPLANT
CATH FOLEY 3WAY  5CC 16FR (CATHETERS) ×1
CATH FOLEY 3WAY 5CC 16FR (CATHETERS) ×2 IMPLANT
COVER BACK TABLE 60X90IN (DRAPES) ×3 IMPLANT
COVER TIP SHEARS 8 DVNC (MISCELLANEOUS) ×2 IMPLANT
COVER TIP SHEARS 8MM DA VINCI (MISCELLANEOUS) ×1
DECANTER SPIKE VIAL GLASS SM (MISCELLANEOUS) ×6 IMPLANT
DEFOGGER SCOPE WARMER CLEARIFY (MISCELLANEOUS) ×3 IMPLANT
DERMABOND ADVANCED (GAUZE/BANDAGES/DRESSINGS) ×1
DERMABOND ADVANCED .7 DNX12 (GAUZE/BANDAGES/DRESSINGS) ×2 IMPLANT
DRAPE ARM DVNC X/XI (DISPOSABLE) ×8 IMPLANT
DRAPE COLUMN DVNC XI (DISPOSABLE) ×2 IMPLANT
DRAPE DA VINCI XI ARM (DISPOSABLE) ×4
DRAPE DA VINCI XI COLUMN (DISPOSABLE) ×1
DURAPREP 26ML APPLICATOR (WOUND CARE) ×3 IMPLANT
ELECT REM PT RETURN 15FT ADLT (MISCELLANEOUS) ×3 IMPLANT
GLOVE BIOGEL PI IND STRL 7.0 (GLOVE) ×10 IMPLANT
GLOVE BIOGEL PI INDICATOR 7.0 (GLOVE) ×5
GLOVE ECLIPSE 6.5 STRL STRAW (GLOVE) ×9 IMPLANT
HEMOSTAT ARISTA ABSORB 3G PWDR (MISCELLANEOUS) ×1 IMPLANT
IRRIG SUCT STRYKERFLOW 2 WTIP (MISCELLANEOUS) ×3
IRRIGATION SUCT STRKRFLW 2 WTP (MISCELLANEOUS) ×2 IMPLANT
LEGGING LITHOTOMY PAIR STRL (DRAPES) ×3 IMPLANT
NEEDLE INSUFFLATION 120MM (ENDOMECHANICALS) ×3 IMPLANT
NEEDLE INSUFFLATION 150MM (ENDOMECHANICALS) ×1 IMPLANT
OBTURATOR OPTICAL STANDARD 8MM (TROCAR) ×1
OBTURATOR OPTICAL STND 8 DVNC (TROCAR) ×2
OBTURATOR OPTICALSTD 8 DVNC (TROCAR) IMPLANT
OCCLUDER COLPOPNEUMO (BALLOONS) ×3 IMPLANT
PACK ROBOT WH (CUSTOM PROCEDURE TRAY) ×3 IMPLANT
PACK ROBOTIC GOWN (GOWN DISPOSABLE) ×3 IMPLANT
PACK TRENDGUARD 450 HYBRID PRO (MISCELLANEOUS) IMPLANT
PAD PREP 24X48 CUFFED NSTRL (MISCELLANEOUS) ×3 IMPLANT
POSITIONER SURGICAL ARM (MISCELLANEOUS) ×6 IMPLANT
SEAL CANN UNIV 5-8 DVNC XI (MISCELLANEOUS) ×6 IMPLANT
SEAL XI 5MM-8MM UNIVERSAL (MISCELLANEOUS) ×4
SEALER VESSEL DA VINCI XI (MISCELLANEOUS) ×1
SEALER VESSEL EXT DVNC XI (MISCELLANEOUS) IMPLANT
SET CYSTO W/LG BORE CLAMP LF (SET/KITS/TRAYS/PACK) ×1 IMPLANT
SET TRI-LUMEN FLTR TB AIRSEAL (TUBING) ×3 IMPLANT
SUT VIC AB 0 CT1 36 (SUTURE) ×6 IMPLANT
SUT VICRYL 0 UR6 27IN ABS (SUTURE) IMPLANT
SUT VICRYL 4-0 PS2 18IN ABS (SUTURE) ×6 IMPLANT
SUT VLOC 180 0 9IN  GS21 (SUTURE)
SUT VLOC 180 0 9IN GS21 (SUTURE) IMPLANT
TIP RUMI ORANGE 6.7MMX12CM (TIP) IMPLANT
TIP UTERINE 5.1X6CM LAV DISP (MISCELLANEOUS) ×2 IMPLANT
TIP UTERINE 6.7X10CM GRN DISP (MISCELLANEOUS) IMPLANT
TIP UTERINE 6.7X6CM WHT DISP (MISCELLANEOUS) IMPLANT
TIP UTERINE 6.7X8CM BLUE DISP (MISCELLANEOUS) ×1 IMPLANT
TOWEL OR 17X26 10 PK STRL BLUE (TOWEL DISPOSABLE) ×6 IMPLANT
TRENDGUARD 450 HYBRID PRO PACK (MISCELLANEOUS)
TROCAR PORT AIRSEAL 8X120 (TROCAR) ×3 IMPLANT
WATER STERILE IRR 1000ML POUR (IV SOLUTION) ×3 IMPLANT

## 2018-03-30 NOTE — H&P (Signed)
Samantha Mcgee is an 52 y.o. female. G2P1011 BF presents for davinci robotic total hysterectomy, bilateral salpingectomy due to pelvic pain, DUB and uterine fibroids. ebx benign.   Pertinent Gynecological History: Menses: prolonged and heavy Bleeding: dysfunctional uterine bleeding Contraception: IUD DES exposure: denies Blood transfusions: none Sexually transmitted diseases: HSV Previous GYN Procedures: none  Last mammogram: normal Date:2018 Last pap: normal Date: 2019 OB History: G2P1011   Menstrual History: Menarche age: n/a No LMP recorded. (Menstrual status: IUD).    Past Medical History:  Diagnosis Date  . Anxiety   . Arthritis   . Asthma   . Headache   . History of kidney stones   . Interstitial cystitis   . Seasonal allergies     Past Surgical History:  Procedure Laterality Date  . CESAREAN SECTION    . CHOLECYSTECTOMY N/A 10/07/2016   Procedure: LAPAROSCOPIC CHOLECYSTECTOMY WITH INTRAOPERATIVE CHOLANGIOGRAM;  Surgeon: Erroll Luna, MD;  Location: [facility];  Service: General;  Laterality: N/A;  . CYSTITIS    . FOOT SURGERY      Family History  Problem Relation Age of Onset  . Eczema Brother   . Eczema Daughter   . Allergic rhinitis Neg Hx   . Angioedema Neg Hx   . Asthma Neg Hx   . Immunodeficiency Neg Hx   . Urticaria Neg Hx     Social History:  reports that she has never smoked. She has never used smokeless tobacco. She reports that she drinks about 1.0 standard drinks of alcohol per week. She reports that she does not use drugs.  Allergies:  Allergies  Allergen Reactions  . Latex Other (See Comments)    Blisters.    Medications Prior to Admission  Medication Sig Dispense Refill Last Dose  . albuterol (PROAIR HFA) 108 (90 Base) MCG/ACT inhaler Use 2 puffs every 4 hours as needed for cough or wheeze.  May use 2 puffs 10-20 minutes prior to exercise. 1 Inhaler 1 Past Month at Unknown time  . amitriptyline (ELAVIL) 10 MG tablet Take 20 mg by  mouth at bedtime.   03/28/2018 at Unknown time  . ASMANEX, 60 METERED DOSES, 220 MCG/INH inhaler INHALE 1 PUFF INTO LINGS TWICE A DAY 1 Inhaler 0 03/30/2018 at 0400  . cetirizine (ZYRTEC) 10 MG tablet TAKE 1 TABLET BY MOUTH EVERY DAY FOR RUNNY NOSE OR ITCHING (Patient taking differently: Take 10 mg by mouth daily. ) 90 tablet 1 03/29/2018 at Unknown time  . Clindamycin-Benzoyl Per, Refr, gel Apply 1 application topically daily.  3 03/29/2018 at Unknown time  . ELMIRON 100 MG capsule Take 100 mg by mouth 2 (two) times daily.  0 03/29/2018 at Unknown time  . linaclotide (LINZESS) 145 MCG CAPS capsule Take 145 mcg by mouth daily before breakfast.   03/29/2018 at Unknown time  . montelukast (SINGULAIR) 10 MG tablet Take 1 tablet (10 mg total) by mouth at bedtime. 30 tablet 5 03/28/2018  . Multiple Minerals-Vitamins (CAL MAG ZINC +D3 PO) Take 1 tablet by mouth daily.   03/27/2018  . Omega-3 1000 MG CAPS Take 1,000 mg by mouth daily.   03/28/2018  . Probiotic Product (PROBIOTIC PO) Take 1 capsule by mouth 2 (two) times daily. ULTRA FLORA    Past Week at Unknown time  . RETIN-A MICRO PUMP 0.08 % GEL Apply 1 application topically at bedtime.  1 03/28/2018  . Turmeric 500 MG CAPS Take 500 mg by mouth daily.   03/27/2018  . vitamin B-12 (CYANOCOBALAMIN) 1000 MCG  tablet Take 1,000 mcg by mouth daily.   03/27/2018  . zolpidem (AMBIEN) 5 MG tablet Take 5-10 mg by mouth at bedtime as needed for sleep.    Past Week at Unknown time  . ZETONNA 37 MCG/ACT AERS USE ONE SPRAY PER NOSTRIL ONCE DAILY (Patient not taking: Reported on 03/21/2018) 6.1 Inhaler 0 Not Taking at Unknown time    Review of Systems  All other systems reviewed and are negative.   Blood pressure 115/72, pulse 71, temperature 98.3 F (36.8 C), temperature source Oral, resp. rate 16, height 5\' 4"  (1.626 m), weight 84 kg, SpO2 94 %. Physical Exam  Constitutional: She is oriented to person, place, and time. She appears well-developed and well-nourished.   HENT:  Head: Atraumatic.  Eyes: EOM are normal.  Neck: Neck supple.  Cardiovascular: Regular rhythm.  Respiratory: Breath sounds normal.  GI: Soft.  Transverse skin incision Palp mass 3 FB above symphysis  Genitourinary:  Genitourinary Comments: Vulva nl Vagina nl Cervix parous Uterus 12 weeks irreg And no palp mass  Musculoskeletal: She exhibits no edema.  Neurological: She is alert and oriented to person, place, and time.  Skin: Skin is warm and dry.  Psychiatric: She has a normal mood and affect.    Results for orders placed or performed during the hospital encounter of 03/30/18 (from the past 24 hour(s))  Pregnancy, urine     Status: None   Collection Time: 03/30/18  5:52 AM  Result Value Ref Range   Preg Test, Ur NEGATIVE NEGATIVE    No results found.  Assessment/Plan: Symptomatic uterine fibroid P) davinci robotic total hysterectomy, bilateral salpingectomy Risk of surgery includes infection, bleeding, poss need for blood transfusion and its risk, internal scar tissue, poss need for ovarian surgery due to pathology, pain, poss need for open case. All ? answered  Mahlon Gabrielle A Lathen Seal 03/30/2018, 7:17 AM

## 2018-03-30 NOTE — Anesthesia Procedure Notes (Signed)
Procedure Name: Intubation Date/Time: 03/30/2018 8:27 AM Performed by: Jonna Munro, CRNA Pre-anesthesia Checklist: Patient identified, Emergency Drugs available, Suction available, Patient being monitored and Timeout performed Patient Re-evaluated:Patient Re-evaluated prior to induction Oxygen Delivery Method: Circle system utilized Preoxygenation: Pre-oxygenation with 100% oxygen Induction Type: IV induction Ventilation: Mask ventilation without difficulty Laryngoscope Size: Mac and 3 Grade View: Grade II Tube type: Oral Tube size: 7.0 mm Number of attempts: 2 Airway Equipment and Method: Stylet Placement Confirmation: ETT inserted through vocal cords under direct vision,  positive ETCO2 and breath sounds checked- equal and bilateral Secured at: 22 cm Tube secured with: Tape Dental Injury: Teeth and Oropharynx as per pre-operative assessment  Comments: Attempt intubation by CRNA, no view of cords. Mask vent. Easily. Intubated by Dr. Glennon Mac.

## 2018-03-30 NOTE — Brief Op Note (Signed)
03/30/2018  12:55 PM  PATIENT:  Samantha Mcgee  52 y.o. female  PRE-OPERATIVE DIAGNOSIS:  Symptomatic Uterine Fibroids, previous cesarean section  POST-OPERATIVE DIAGNOSIS:  Symptomatic Uterine Fibroids, previous cesarean section, pelvic endometriosis  PROCEDURE:  Da Jenne Pane robotic total hysterectomy, bilateral salpingectomy, excision of pelvic endometriosis, cystoscopy  SURGEON:  Surgeon(s) and Role:    * Servando Salina, MD - Primary  PHYSICIAN ASSISTANT:   ASSISTANTS: Artelia Laroche, CNM   ANESTHESIA:   general FINDINGS; large uterus, nl tubes and ovaries, posterior cul de sac endometriotic implants, nl liver edges EBL:  75 mL   BLOOD ADMINISTERED:none  DRAINS: none   LOCAL MEDICATIONS USED:  MARCAINE     SPECIMEN:  Source of Specimen:  uterus with cervix , tubes, endometriotic implant  DISPOSITION OF SPECIMEN:  PATHOLOGY  COUNTS:  YES  TOURNIQUET:  * No tourniquets in log *  DICTATION: .Other Dictation: Dictation Number [id number]  PLAN OF CARE: Admit for overnight observation  PATIENT DISPOSITION:  PACU - hemodynamically stable.   Delay start of Pharmacological VTE agent (>24hrs) due to surgical blood loss or risk of bleeding: no

## 2018-03-30 NOTE — Anesthesia Postprocedure Evaluation (Signed)
Anesthesia Post Note  Patient: WETONA VIRAMONTES  Procedure(s) Performed: XI ROBOTIC ASSISTED LAPAROSCOPIC HYSTERECTOMY AND SALPINGECTOMY, (Bilateral ) CYSTOSCOPY (N/A )     Patient location during evaluation: PACU Anesthesia Type: General Level of consciousness: awake and alert, oriented and patient cooperative Pain management: pain level controlled Vital Signs Assessment: post-procedure vital signs reviewed and stable Respiratory status: spontaneous breathing, nonlabored ventilation, respiratory function stable and patient connected to nasal cannula oxygen Cardiovascular status: blood pressure returned to baseline and stable Postop Assessment: no apparent nausea or vomiting Anesthetic complications: no    Last Vitals:  Vitals:   03/30/18 1430 03/30/18 1500  BP: 125/73 121/78  Pulse: 90 85  Resp: 16 16  Temp:    SpO2: 100% 97%    Last Pain:  Vitals:   03/30/18 1515  TempSrc:   PainSc: (P) 7                  Jenean Escandon,E. Dian Minahan

## 2018-03-30 NOTE — Transfer of Care (Signed)
Immediate Anesthesia Transfer of Care Note  Patient: Samantha Mcgee  Procedure(s) Performed: XI ROBOTIC ASSISTED LAPAROSCOPIC HYSTERECTOMY AND SALPINGECTOMY, (Bilateral ) CYSTOSCOPY (N/A )  Patient Location: PACU  Anesthesia Type:General  Level of Consciousness: awake, alert  and oriented  Airway & Oxygen Therapy: Patient Spontanous Breathing and Patient connected to face mask oxygen  Post-op Assessment: Report given to RN and Post -op Vital signs reviewed and stable  Post vital signs: Reviewed and stable  Last Vitals:  Vitals Value Taken Time  BP    Temp    Pulse 112 03/30/2018 12:14 PM  Resp    SpO2 100 % 03/30/2018 12:14 PM  Vitals shown include unvalidated device data.  Last Pain:  Vitals:   03/30/18 0626  TempSrc:   PainSc: 3       Patients Stated Pain Goal: 2 (61/95/09 [id number])  Complications: No apparent anesthesia complications

## 2018-03-31 ENCOUNTER — Encounter (HOSPITAL_COMMUNITY): Payer: Self-pay | Admitting: Obstetrics and Gynecology

## 2018-03-31 DIAGNOSIS — N803 Endometriosis of pelvic peritoneum: Secondary | ICD-10-CM | POA: Diagnosis not present

## 2018-03-31 LAB — CBC
HCT: 35 % — ABNORMAL LOW (ref 36.0–46.0)
Hemoglobin: 11.4 g/dL — ABNORMAL LOW (ref 12.0–15.0)
MCH: 28.3 pg (ref 26.0–34.0)
MCHC: 32.6 g/dL (ref 30.0–36.0)
MCV: 86.8 fL (ref 78.0–100.0)
Platelets: 276 10*3/uL (ref 150–400)
RBC: 4.03 MIL/uL (ref 3.87–5.11)
RDW: 13.5 % (ref 11.5–15.5)
WBC: 14.5 10*3/uL — ABNORMAL HIGH (ref 4.0–10.5)

## 2018-03-31 LAB — BASIC METABOLIC PANEL
Anion gap: 6 (ref 5–15)
BUN: 9 mg/dL (ref 6–20)
CO2: 26 mmol/L (ref 22–32)
Calcium: 8.4 mg/dL — ABNORMAL LOW (ref 8.9–10.3)
Chloride: 108 mmol/L (ref 98–111)
Creatinine, Ser: 0.68 mg/dL (ref 0.44–1.00)
GFR calc Af Amer: >60 mL/min (ref >60)
GFR calc non Af Amer: >60 mL/min (ref >60)
Glucose, Bld: 138 mg/dL — ABNORMAL HIGH (ref 70–99)
Potassium: 3.8 mmol/L (ref 3.5–5.1)
Sodium: 140 mmol/L (ref 135–145)

## 2018-03-31 MED ORDER — SIMETHICONE 80 MG PO CHEW
CHEWABLE_TABLET | ORAL | Status: AC
  Filled 2018-03-31: qty 1

## 2018-03-31 MED ORDER — OXYCODONE-ACETAMINOPHEN 5-325 MG PO TABS
ORAL_TABLET | ORAL | Status: AC
  Filled 2018-03-31: qty 1

## 2018-03-31 MED ORDER — OXYCODONE-ACETAMINOPHEN 5-325 MG PO TABS: 1.0000 | ORAL_TABLET | ORAL | 0 refills | Status: AC | PRN

## 2018-03-31 MED ORDER — PROMETHAZINE HCL 25 MG PO TABS
ORAL_TABLET | ORAL | Status: AC
  Filled 2018-03-31: qty 1

## 2018-03-31 MED ORDER — OXYCODONE-ACETAMINOPHEN 5-325 MG PO TABS
1.0000 | ORAL_TABLET | Freq: Once | ORAL | Status: AC
  Administered 2018-03-31: 1 via ORAL

## 2018-03-31 MED ORDER — IBUPROFEN 800 MG PO TABS: 800.0000 mg | ORAL_TABLET | Freq: Three times a day (TID) | ORAL | 4 refills | Status: DC | PRN

## 2018-03-31 MED ORDER — KETOROLAC TROMETHAMINE 30 MG/ML IJ SOLN
INTRAMUSCULAR | Status: AC
  Filled 2018-03-31: qty 1

## 2018-03-31 NOTE — Discharge Summary (Signed)
Physician Discharge Summary  Patient ID: Samantha Mcgee MRN: [id number] DOB/AGE: February 13, 1966 52 y.o.  Admit date: 03/30/2018 Discharge date: 03/31/2018  Admission Diagnoses: symptomatic uterine fibroids, previous cesarean section  Discharge Diagnoses: symptomatic uterine fibroids, previous cesarean section, pelvic endometriosis Active Problems:   Status post total hysterectomy   Discharged Condition: stable  Hospital Course: pt was admitted to [facility] where she underwent da vinci robotic total hysterectomy, bilateral salpingectomy, cystoscopy, excision of pelvic endometriosis. Uncomplicated postop course   Consults: None  Significant Diagnostic Studies: labs:  CBC Latest Ref Rng & Units 03/31/2018 03/24/2018 10/04/2016  WBC 4.0 - 10.5 K/uL 14.5(H) 9.6 7.5  Hemoglobin 12.0 - 15.0 g/dL 11.4(L) 13.7 13.5  Hematocrit 36.0 - 46.0 % 35.0(L) 41.3 41.3  Platelets 150 - 400 K/uL 276 285 313   Lab Results  Component Value Date   CREATININE 0.68 03/31/2018   CREATININE 0.67 03/24/2018   CREATININE 0.75 10/04/2016    Treatments: surgery: da vinci robotic total hysterectomy, bilateral salpingectomy, cystoscopy, excision of pelvic endometriosis  Discharge Exam: Blood pressure 107/62, pulse 72, temperature 98.6 F (37 C), resp. rate 15, height 5\' 4"  (1.626 m), weight 84 kg, SpO2 99 %. General appearance: alert, cooperative and no distress Resp: clear to auscultation bilaterally Cardio: regular rate and rhythm, S1, S2 normal, no murmur, click, rub or gallop GI: soft active bs non distended Pelvic: deferred Extremities: no edema, redness or tenderness in the calves or thighs Incision/Wound: c/d/i  Disposition: Discharge disposition: 01-Home or Self Care       Discharge Instructions    Call MD for:  persistant nausea and vomiting   Complete by:  As directed    Call MD for:  redness, tenderness, or signs of infection (pain, swelling, redness, odor or green/yellow discharge around  incision site)   Complete by:  As directed    Call MD for:  temperature >100.4   Complete by:  As directed    Diet - low sodium heart healthy   Complete by:  As directed    Discharge instructions   Complete by:  As directed    Call if temperature greater than equal to 100.4, nothing per vagina for 4-6 weeks or severe nausea vomiting, increased incisional pain , drainage or redness in the incision site, no straining with bowel movements, showers no bath   May walk up steps   Complete by:  As directed      Allergies as of 03/31/2018      Reactions   Latex Other (See Comments)   Blisters.      Medication List    TAKE these medications   albuterol 108 (90 Base) MCG/ACT inhaler Commonly known as:  PROVENTIL HFA;VENTOLIN HFA Use 2 puffs every 4 hours as needed for cough or wheeze.  May use 2 puffs 10-20 minutes prior to exercise.   amitriptyline 10 MG tablet Commonly known as:  ELAVIL Take 20 mg by mouth at bedtime.   ASMANEX (60 METERED DOSES) 220 MCG/INH inhaler Generic drug:  mometasone INHALE 1 PUFF INTO LINGS TWICE A DAY   CAL MAG ZINC +D3 PO Take 1 tablet by mouth daily.   cetirizine 10 MG tablet Commonly known as:  ZYRTEC TAKE 1 TABLET BY MOUTH EVERY DAY FOR RUNNY NOSE OR ITCHING What changed:    how much to take  how to take this  when to take this  additional instructions   Clindamycin-Benzoyl Per (Refr) gel Apply 1 application topically daily.   ELMIRON 100 MG capsule  Generic drug:  pentosan polysulfate Take 100 mg by mouth 2 (two) times daily.   ibuprofen 800 MG tablet Commonly known as:  ADVIL,MOTRIN Take 1 tablet (800 mg total) by mouth every 8 (eight) hours as needed (mild pain).   linaclotide 145 MCG Caps capsule Commonly known as:  LINZESS Take 145 mcg by mouth daily before breakfast.   montelukast 10 MG tablet Commonly known as:  SINGULAIR Take 1 tablet (10 mg total) by mouth at bedtime.   Omega-3 1000 MG Caps Take 1,000 mg by mouth  daily.   oxyCODONE-acetaminophen 5-325 MG tablet Commonly known as:  PERCOCET/ROXICET Take 1 tablet by mouth every 4 (four) hours as needed for up to 7 days for severe pain.   PROBIOTIC PO Take 1 capsule by mouth 2 (two) times daily. ULTRA FLORA   RETIN-A MICRO PUMP 0.08 % Gel Generic drug:  Tretinoin Microsphere Apply 1 application topically at bedtime.   Turmeric 500 MG Caps Take 500 mg by mouth daily.   vitamin B-12 1000 MCG tablet Commonly known as:  CYANOCOBALAMIN Take 1,000 mcg by mouth daily.   zolpidem 5 MG tablet Commonly known as:  AMBIEN Take 5-10 mg by mouth at bedtime as needed for sleep.      Follow-up Information    Servando Salina, MD Follow up in 2 week(s).   Specialty:  Obstetrics and Gynecology Contact information: [address] [place] [address] [phone]           Signed: Alanda Slim A Tyiana Hill 03/31/2018, 8:38 AM

## 2018-03-31 NOTE — Op Note (Signed)
NAME: Samantha, Mcgee MEDICAL RECORD [id number] ACCOUNT [id number] DATE OF BIRTH:10-25-1965 FACILITY: [facility] LOCATION: [facility] PHYSICIAN:Shuntel Fishburn A. Izabella Marcantel, MD  OPERATIVE REPORT  DATE OF PROCEDURE:  03/30/2018  PREOPERATIVE DIAGNOSES:  Pelvic pain, dysfunctional uterine bleeding, symptomatic uterine fibroids.  PROCEDURE:  Da Vinci robotic total hysterectomy, bilateral salpingectomy, excision of pelvic endometriosis, cystoscopy.  POSTOPERATIVE DIAGNOSES:  Stage I pelvic endometriosis, dysfunctional uterine bleeding, symptomatic uterine fibroids.  ANESTHESIA:  General.  SURGEON:  Servando Salina, MD  ASSISTANT:  Artelia Laroche, CNM.  DESCRIPTION OF PROCEDURE:  Under adequate general anesthesia, the patient was placed in the dorsal lithotomy position.  She was positioned for robotic surgery.  Examination under anesthesia revealed a 12 week size uterus.  No adnexal masses could be appreciated.  The patient was sterilely prepped and draped in the usual fashion and a Foley catheter was sterilely placed.  A weighted speculum was placed in the vagina.  Sims retractor was used anteriorly.  The cervix was identified, 0 Vicryl  figure-of-eight suture was placed on the anterior and on the posterior lip of the cervix.  The uterus then sounded to 8 cm; however, the #8 uterine manipulator that we had attached to a medium KOH ring would not insufflate.  Therefore, a #6 uterine manipulator was utilized with a small KOH ring was utilized and the uterine manipulator was successfully placed.  The retractors were then removed.  Attention was then turned to the abdomen.  Marcaine 0.25% was injected along the planned site.   Infraumbilical incision was then made.  Veress needle was introduced.  Opening pressure of 7 was noted after the placement was tested.  Then, 3 liters of CO2 was insufflated.  Veress needle was then removed.  The robotic port was then placed.  Once that  was done, panoramic  inspection of the pelvis was notable for a large prominent uterus, normal liver edge, some adhesions around the right gutters.  The patient was placed in Trendelenburg position.  Additional port site was then placed, two on the right,  one on the far left and in between that was AirSeal #8.  The other additional ports were then placed under direct visualization.  The robot was docked and in arm #1 a vessel sealer was then placed.  In arm #3 the long bipolar instrument.  In arm #4 the  monopolar scissors.  I then went to the surgical console.  At the surgical console, the pelvis was inspected.  Both ureters were seen peristalsing.  There were some adhesions on the left that twisted the ovary and the ovary had a focal attachment to the distal part of bowels.  That adhesion was lysed carefully as was the adhesions proximal to the left adnexa.  Once it was fully cleared, the procedure was started with the fallopian tube on the left.  The mesosalpinx was serially clamped, cauterized and  cut and removed.  The left retroperitoneal space was opened and a window was placed in the medial aspect of the peritoneum.  The left uteroovarian ligament was serially clamped, cauterized, and cut.  The anterior and posterior leaf was opened.  The round ligament was clamped, cauterized, and cut.  The bladder adhesions from the previous cesarean section was noted.  At that point, attention was then turned anteriorly and the vesicouterine peritoneum was opened sharply and with sharp dissection was  brought down inferiorly.  The adhesion from the left side had caused the uterus to levorotate and that adhesion was then lysed returning the usual anatomic  locations to each area.  The left uterine vessels were finally cauterized, but not cut.  The bladder was continued to be displaced inferiorly.  Attention was then turned to the opposite side where the round ligament was identified.  The right fallopian tube was excised with serial  successive cutting of the mesosalpinx.  The right retroperitoneal  space was then opened.  The proximal portion of the right uretero-ovarian ligament was serially clamped, cauterized and then cut.  The uterine vessels were skeletonized subsequently.  Additional firm adherent adhesions on the right side of the lower uterine segment was sharply dissected and displaced inferiorly allowing for the right uterine vessels to be serially clamped, cauterized, and cut.  Attention was then turned back to the opposite side where the uterine vessels were then further identified, clamped, cauterized, and cut.  Once this was done, the vaginal insufflator was utilized.  The top of the RUMI cup was identified.  The cervicovaginal junction was then opened anteriorly and posteriorly and circumferentially the cervix was  removed from the vagina.  In the posterior cul-de-sac was a focal area of endometriotic implant.  At that point, the uterus was removed through the vagina.  Good hemostasis was noted around the vaginal cuff.  The posterior vaginal cuff was elevated.  The endometriotic foci was identified, carefully excised and removed.  Bleeding and hemostasis was noted.  The instruments were then replaced with a long mega suture needle driver and the long bipolar remained.  Then, 0 V-Loc suture was placed.  Vaginal cuff was closed in a running stitch that was oversewn in the midline.  The abdomen was irrigated and suctioned of debris.  Due to the right side, a decision was made to perform intraoperatively cystoscopy.  I then left the surgical  console and performed a cystoscope with a 70 degree lens.  Both ureter were noted to be ejecting urine.  The Foley, which had been removed was then reinserted.  The cystoscope had been removed.  I went back to the surgical console and panoramic inspection of the adnexa showed good hemostasis.  The procedure from the robotic side was done.  The robotic instruments were then removed.  I went  back to the patient's bedside sterilely and the robotic instrument sites were removed.  Arista potato starch was placed in the vaginal cuff area and all instruments were then removed.  Abdomen desufflated and the incisions closed with 4-0 Vicryl subcuticular closure.  Vaginal cuff on digital inspection was well approximated.  SPECIMEN:  The uterus with cervix and the excised posterior cul-de-sac peritoneal endometriotic lesion.  ESTIMATED BLOOD LOSS:  75 mL  INTRAOPERATIVE FLUIDS:  1200 mL  URINE OUTPUT:  240 mL Pyridium-stained urine.  SPONGE AND INSTRUMENT COUNTS:  x2 was correct.  COMPLICATIONS:  None.  The patient tolerated the procedure well and was transferred to recovery room in stable condition.  TN/NUANCE  D:03/30/2018 T:03/31/2018 JOB:[id number]

## 2018-03-31 NOTE — Progress Notes (Signed)
Subjective: Patient reports incisional pain, tolerating PO and no problems voiding.    Objective: I have reviewed patient's vital signs.  vital signs and intake and output, labs. Vitals:   03/31/18 0520 03/31/18 0915  BP: 107/62 127/69  Pulse: 72 94  Resp: 15 18  Temp: 98.6 F (37 C) 98.7 F (37.1 C)  SpO2: 99% 99%   I/O last 3 completed shifts: In: 2345 [P.O.:820; I.V.:1525] Out: 1640 [Urine:1365; Emesis/NG output:200; Blood:75] Total I/O In: 240 [P.O.:240] Out: 300 [Urine:300]  Lab Results  Component Value Date   WBC 14.5 (H) 03/31/2018   HGB 11.4 (L) 03/31/2018   HCT 35.0 (L) 03/31/2018   MCV 86.8 03/31/2018   PLT 276 03/31/2018   Lab Results  Component Value Date   CREATININE 0.68 03/31/2018    EXAM General: alert, cooperative and no distress Resp: clear to auscultation bilaterally Cardio: regular rate and rhythm, S1, S2 normal, no murmur, click, rub or gallop GI: incision: clean, dry and intact and soft obese very active BS Extremities: no edema, redness or tenderness in the calves or thighs Vaginal Bleeding: minimal  Assessment: s/p Procedure(s): XI ROBOTIC ASSISTED LAPAROSCOPIC HYSTERECTOMY AND SALPINGECTOMY, CYSTOSCOPY: stable, progressing well and tolerating diet  Plan: Advance diet Encourage ambulation Discontinue IV fluids Discharge home  F/u 2 weeks D/c instructions reviewed  LOS: 0 days    Marvene Staff, MD 03/31/2018 8:37 AM    03/31/2018, 8:37 AM

## 2018-04-27 DIAGNOSIS — Z09 Encounter for follow-up examination after completed treatment for conditions other than malignant neoplasm: Secondary | ICD-10-CM | POA: Diagnosis not present

## 2018-05-12 ENCOUNTER — Other Ambulatory Visit: Payer: Self-pay

## 2018-05-12 MED ORDER — AMITRIPTYLINE HCL 10 MG PO TABS: 10.0000 mg | ORAL_TABLET | Freq: Every day | ORAL | 0 refills | Status: DC

## 2018-05-27 ENCOUNTER — Other Ambulatory Visit: Payer: Self-pay | Admitting: Allergy

## 2018-05-27 DIAGNOSIS — J4530 Mild persistent asthma, uncomplicated: Secondary | ICD-10-CM

## 2018-05-29 NOTE — Telephone Encounter (Signed)
Courtesy refill  

## 2018-06-05 DIAGNOSIS — J309 Allergic rhinitis, unspecified: Secondary | ICD-10-CM | POA: Diagnosis not present

## 2018-06-05 DIAGNOSIS — J301 Allergic rhinitis due to pollen: Secondary | ICD-10-CM | POA: Diagnosis not present

## 2018-06-05 DIAGNOSIS — J3089 Other allergic rhinitis: Secondary | ICD-10-CM | POA: Diagnosis not present

## 2018-06-05 DIAGNOSIS — J4530 Mild persistent asthma, uncomplicated: Secondary | ICD-10-CM | POA: Diagnosis not present

## 2018-06-15 ENCOUNTER — Encounter: Payer: Self-pay | Admitting: Internal Medicine

## 2018-06-15 ENCOUNTER — Ambulatory Visit (INDEPENDENT_AMBULATORY_CARE_PROVIDER_SITE_OTHER): Payer: 59 | Admitting: Internal Medicine

## 2018-06-15 VITALS — BP 114/72 | HR 87 | Temp 98.6°F | Ht 64.5 in | Wt 187.2 lb

## 2018-06-15 DIAGNOSIS — Z630 Problems in relationship with spouse or partner: Secondary | ICD-10-CM

## 2018-06-15 DIAGNOSIS — J45909 Unspecified asthma, uncomplicated: Secondary | ICD-10-CM | POA: Insufficient documentation

## 2018-06-15 DIAGNOSIS — M722 Plantar fascial fibromatosis: Secondary | ICD-10-CM | POA: Diagnosis not present

## 2018-06-15 DIAGNOSIS — J302 Other seasonal allergic rhinitis: Secondary | ICD-10-CM | POA: Insufficient documentation

## 2018-06-15 DIAGNOSIS — Z0000 Encounter for general adult medical examination without abnormal findings: Secondary | ICD-10-CM | POA: Diagnosis not present

## 2018-06-15 LAB — POCT URINALYSIS DIPSTICK
Bilirubin, UA: NEGATIVE
Glucose, UA: NEGATIVE
Ketones, UA: NEGATIVE
Leukocytes, UA: NEGATIVE
Nitrite, UA: NEGATIVE
Protein, UA: NEGATIVE
Spec Grav, UA: 1.025 (ref 1.010–1.025)
Urobilinogen, UA: 0.2 E.U./dL
pH, UA: 6 (ref 5.0–8.0)

## 2018-06-15 MED ORDER — LINACLOTIDE 145 MCG PO CAPS: 145.0000 ug | ORAL_CAPSULE | Freq: Every day | ORAL | 2 refills | Status: DC

## 2018-06-15 MED ORDER — ELMIRON 100 MG PO CAPS: 100.0000 mg | ORAL_CAPSULE | Freq: Two times a day (BID) | ORAL | 2 refills | Status: DC

## 2018-06-15 NOTE — Patient Instructions (Addendum)
Health Maintenance, Female Adopting a healthy lifestyle and getting preventive care can go a long way to promote health and wellness. Talk with your health care provider about what schedule of regular examinations is right for you. This is a good chance for you to check in with your provider about disease prevention and staying healthy. In between checkups, there are plenty of things you can do on your own. Experts have done a lot of research about which lifestyle changes and preventive measures are most likely to keep you healthy. Ask your health care provider for more information. Weight and diet Eat a healthy diet  Be sure to include plenty of vegetables, fruits, low-fat dairy products, and lean protein.  Do not eat a lot of foods high in solid fats, added sugars, or salt.  Get regular exercise. This is one of the most important things you can do for your health. ? Most adults should exercise for at least 150 minutes each week. The exercise should increase your heart rate and make you sweat (moderate-intensity exercise). ? Most adults should also do strengthening exercises at least twice a week. This is in addition to the moderate-intensity exercise.  Maintain a healthy weight  Body mass index (BMI) is a measurement that can be used to identify possible weight problems. It estimates body fat based on height and weight. Your health care provider can help determine your BMI and help you achieve or maintain a healthy weight.  For females 20 years of age and older: ? A BMI below 18.5 is considered underweight. ? A BMI of 18.5 to 24.9 is normal. ? A BMI of 25 to 29.9 is considered overweight. ? A BMI of 30 and above is considered obese.  Watch levels of cholesterol and blood lipids  You should start having your blood tested for lipids and cholesterol at 51 years of age, then have this test every 5 years.  You may need to have your cholesterol levels checked more often if: ? Your lipid or  cholesterol levels are high. ? You are older than 50 years of age. ? You are at high risk for heart disease.  Cancer screening Lung Cancer  Lung cancer screening is recommended for adults 55-80 years old who are at high risk for lung cancer because of a history of smoking.  A yearly low-dose CT scan of the lungs is recommended for people who: ? Currently smoke. ? Have quit within the past 15 years. ? Have at least a 30-pack-year history of smoking. A pack year is smoking an average of one pack of cigarettes a day for 1 year.  Yearly screening should continue until it has been 15 years since you quit.  Yearly screening should stop if you develop a health problem that would prevent you from having lung cancer treatment.  Breast Cancer  Practice breast self-awareness. This means understanding how your breasts normally appear and feel.  It also means doing regular breast self-exams. Let your health care provider know about any changes, no matter how small.  If you are in your 20s or 30s, you should have a clinical breast exam (CBE) by a health care provider every 1-3 years as part of a regular health exam.  If you are 40 or older, have a CBE every year. Also consider having a breast X-ray (mammogram) every year.  If you have a family history of breast cancer, talk to your health care provider about genetic screening.  If you are at high risk   for breast cancer, talk to your health care provider about having an MRI and a mammogram every year.  Breast cancer gene (BRCA) assessment is recommended for women who have family members with BRCA-related cancers. BRCA-related cancers include: ? Breast. ? Ovarian. ? Tubal. ? Peritoneal cancers.  Results of the assessment will determine the need for genetic counseling and BRCA1 and BRCA2 testing.  Cervical Cancer Your health care provider may recommend that you be screened regularly for cancer of the pelvic organs (ovaries, uterus, and  vagina). This screening involves a pelvic examination, including checking for microscopic changes to the surface of your cervix (Pap test). You may be encouraged to have this screening done every 3 years, beginning at age 22.  For women ages 56-65, health care providers may recommend pelvic exams and Pap testing every 3 years, or they may recommend the Pap and pelvic exam, combined with testing for human papilloma virus (HPV), every 5 years. Some types of HPV increase your risk of cervical cancer. Testing for HPV may also be done on women of any age with unclear Pap test results.  Other health care providers may not recommend any screening for nonpregnant women who are considered low risk for pelvic cancer and who do not have symptoms. Ask your health care provider if a screening pelvic exam is right for you.  If you have had past treatment for cervical cancer or a condition that could lead to cancer, you need Pap tests and screening for cancer for at least 20 years after your treatment. If Pap tests have been discontinued, your risk factors (such as having a new sexual partner) need to be reassessed to determine if screening should resume. Some women have medical problems that increase the chance of getting cervical cancer. In these cases, your health care provider may recommend more frequent screening and Pap tests.  Colorectal Cancer  This type of cancer can be detected and often prevented.  Routine colorectal cancer screening usually begins at 51 years of age and continues through 51 years of age.  Your health care provider may recommend screening at an earlier age if you have risk factors for colon cancer.  Your health care provider may also recommend using home test kits to check for hidden blood in the stool.  A small camera at the end of a tube can be used to examine your colon directly (sigmoidoscopy or colonoscopy). This is done to check for the earliest forms of colorectal  cancer.  Routine screening usually begins at age 33.  Direct examination of the colon should be repeated every 5-10 years through 51 years of age. However, you may need to be screened more often if early forms of precancerous polyps or small growths are found.  Skin Cancer  Check your skin from head to toe regularly.  Tell your health care provider about any new moles or changes in moles, especially if there is a change in a mole's shape or color.  Also tell your health care provider if you have a mole that is larger than the size of a pencil eraser.  Always use sunscreen. Apply sunscreen liberally and repeatedly throughout the day.  Protect yourself by wearing long sleeves, pants, a wide-brimmed hat, and sunglasses whenever you are outside.  Heart disease, diabetes, and high blood pressure  High blood pressure causes heart disease and increases the risk of stroke. High blood pressure is more likely to develop in: ? People who have blood pressure in the high end of  the normal range (130-139/85-89 mm Hg). ? People who are overweight or obese. ? People who are African American.  If you are 21-29 years of age, have your blood pressure checked every 3-5 years. If you are 3 years of age or older, have your blood pressure checked every year. You should have your blood pressure measured twice-once when you are at a hospital or clinic, and once when you are not at a hospital or clinic. Record the average of the two measurements. To check your blood pressure when you are not at a hospital or clinic, you can use: ? An automated blood pressure machine at a pharmacy. ? A home blood pressure monitor.  If you are between 17 years and 37 years old, ask your health care provider if you should take aspirin to prevent strokes.  Have regular diabetes screenings. This involves taking a blood sample to check your fasting blood sugar level. ? If you are at a normal weight and have a low risk for diabetes,  have this test once every three years after 51 years of age. ? If you are overweight and have a high risk for diabetes, consider being tested at a younger age or more often. Preventing infection Hepatitis B  If you have a higher risk for hepatitis B, you should be screened for this virus. You are considered at high risk for hepatitis B if: ? You were born in a country where hepatitis B is common. Ask your health care provider which countries are considered high risk. ? Your parents were born in a high-risk country, and you have not been immunized against hepatitis B (hepatitis B vaccine). ? You have HIV or AIDS. ? You use needles to inject street drugs. ? You live with someone who has hepatitis B. ? You have had sex with someone who has hepatitis B. ? You get hemodialysis treatment. ? You take certain medicines for conditions, including cancer, organ transplantation, and autoimmune conditions.  Hepatitis C  Blood testing is recommended for: ? Everyone born from [birth year] through 1964. ? Anyone with known risk factors for hepatitis C.  Sexually transmitted infections (STIs)  You should be screened for sexually transmitted infections (STIs) including gonorrhea and chlamydia if: ? You are sexually active and are younger than 51 years of age. ? You are older than 51 years of age and your health care provider tells you that you are at risk for this type of infection. ? Your sexual activity has changed since you were last screened and you are at an increased risk for chlamydia or gonorrhea. Ask your health care provider if you are at risk.  If you do not have HIV, but are at risk, it may be recommended that you take a prescription medicine daily to prevent HIV infection. This is called pre-exposure prophylaxis (PrEP). You are considered at risk if: ? You are sexually active and do not regularly use condoms or know the HIV status of your partner(s). ? You take drugs by injection. ? You are  sexually active with a partner who has HIV.  Talk with your health care provider about whether you are at high risk of being infected with HIV. If you choose to begin PrEP, you should first be tested for HIV. You should then be tested every 3 months for as long as you are taking PrEP. Pregnancy  If you are premenopausal and you may become pregnant, ask your health care provider about preconception counseling.  If you may become  pregnant, take 400 to 800 micrograms (mcg) of folic acid every day.  If you want to prevent pregnancy, talk to your health care provider about birth control (contraception). Osteoporosis and menopause  Osteoporosis is a disease in which the bones lose minerals and strength with aging. This can result in serious bone fractures. Your risk for osteoporosis can be identified using a bone density scan.  If you are 54 years of age or older, or if you are at risk for osteoporosis and fractures, ask your health care provider if you should be screened.  Ask your health care provider whether you should take a calcium or vitamin D supplement to lower your risk for osteoporosis.  Menopause may have certain physical symptoms and risks.  Hormone replacement therapy may reduce some of these symptoms and risks. Talk to your health care provider about whether hormone replacement therapy is right for you. Follow these instructions at home:  Schedule regular health, dental, and eye exams.  Stay current with your immunizations.  Do not use any tobacco products including cigarettes, chewing tobacco, or electronic cigarettes.  If you are pregnant, do not drink alcohol.  If you are breastfeeding, limit how much and how often you drink alcohol.  Limit alcohol intake to no more than 1 drink per day for nonpregnant women. One drink equals 12 ounces of beer, 5 ounces of wine, or 1 ounces of hard liquor.  Do not use street drugs.  Do not share needles.  Ask your health care  provider for help if you need support or information about quitting drugs.  Tell your health care provider if you often feel depressed.  Tell your health care provider if you have ever been abused or do not feel safe at home. This information is not intended to replace advice given to you by your health care provider. Make sure you discuss any questions you have with your health care provider. Document Released: 01/11/2011 Document Revised: 12/04/2015 Document Reviewed: 04/01/2015 Elsevier Interactive Patient Education  2018 [employer].    Plantar Fasciitis Plantar fasciitis is a painful foot condition that affects the heel. It occurs when the band of tissue that connects the toes to the heel bone (plantar fascia) becomes irritated. This can happen after exercising too much or doing other repetitive activities (overuse injury). The pain from plantar fasciitis can range from mild irritation to severe pain that makes it difficult for you to walk or move. The pain is usually worse in the morning or after you have been sitting or lying down for a while. What are the causes? This condition may be caused by:  Standing for long periods of time.  Wearing shoes that do not fit.  Doing high-impact activities, including running, aerobics, and ballet.  Being overweight.  Having an abnormal way of walking (gait).  Having tight calf muscles.  Having high arches in your feet.  Starting a new athletic activity.  What are the signs or symptoms? The main symptom of this condition is heel pain. Other symptoms include:  Pain that gets worse after activity or exercise.  Pain that is worse in the morning or after resting.  Pain that goes away after you walk for a few minutes.  How is this diagnosed? This condition may be diagnosed based on your signs and symptoms. Your health care provider will also do a physical exam to check for:  A tender area on the bottom of your foot.  A high arch in  your foot.  Pain when you move your foot.  Difficulty moving your foot.  You may also need to have imaging studies to confirm the diagnosis. These can include:  X-rays.  Ultrasound.  MRI.  How is this treated? Treatment for plantar fasciitis depends on the severity of the condition. Your treatment may include:  Rest, ice, and over-the-counter pain medicines to manage your pain.  Exercises to stretch your calves and your plantar fascia.  A splint that holds your foot in a stretched, upward position while you sleep (night splint).  Physical therapy to relieve symptoms and prevent problems in the future.  Cortisone injections to relieve severe pain.  Extracorporeal shock wave therapy (ESWT) to stimulate damaged plantar fascia with electrical impulses. It is often used as a last resort before surgery.  Surgery, if other treatments have not worked after 12 months.  Follow these instructions at home:  Take medicines only as directed by your health care provider.  Avoid activities that cause pain.  Roll the bottom of your foot over a bag of ice or a bottle of cold water. Do this for 20 minutes, 3-4 times a day.  Perform simple stretches as directed by your health care provider.  Try wearing athletic shoes with air-sole or gel-sole cushions or soft shoe inserts.  Wear a night splint while sleeping, if directed by your health care provider.  Keep all follow-up appointments with your health care provider. How is this prevented?  Do not perform exercises or activities that cause heel pain.  Consider finding low-impact activities if you continue to have problems.  Lose weight if you need to. The best way to prevent plantar fasciitis is to avoid the activities that aggravate your plantar fascia. Contact a health care provider if:  Your symptoms do not go away after treatment with home care measures.  Your pain gets worse.  Your pain affects your ability to move or do  your daily activities. This information is not intended to replace advice given to you by your health care provider. Make sure you discuss any questions you have with your health care provider. Document Released: 03/23/2001 Document Revised: 12/01/2015 Document Reviewed: 05/08/2014 Elsevier Interactive Patient Education  [employer].

## 2018-06-16 LAB — LIPID PANEL
Chol/HDL Ratio: 3.6 ratio (ref 0.0–4.4)
Cholesterol, Total: 184 mg/dL (ref 100–199)
HDL: 51 mg/dL (ref >39)
LDL Calculated: 119 mg/dL — ABNORMAL HIGH (ref 0–99)
Triglycerides: 72 mg/dL (ref 0–149)
VLDL Cholesterol Cal: 14 mg/dL (ref 5–40)

## 2018-06-16 LAB — CMP14+EGFR
ALT: 18 [IU]/L (ref 0–32)
AST: 15 [IU]/L (ref 0–40)
Albumin/Globulin Ratio: 1.5 (ref 1.2–2.2)
Albumin: 4.5 g/dL (ref 3.5–5.5)
Alkaline Phosphatase: 85 [IU]/L (ref 39–117)
BUN/Creatinine Ratio: 16 (ref 9–23)
BUN: 13 mg/dL (ref 6–24)
Bilirubin Total: 0.3 mg/dL (ref 0.0–1.2)
CO2: 18 mmol/L — ABNORMAL LOW (ref 20–29)
Calcium: 9.4 mg/dL (ref 8.7–10.2)
Chloride: 103 mmol/L (ref 96–106)
Creatinine, Ser: 0.83 mg/dL (ref 0.57–1.00)
GFR calc Af Amer: 94 mL/min/{1.73_m2}
GFR calc non Af Amer: 82 mL/min/{1.73_m2}
Globulin, Total: 3.1 g/dL (ref 1.5–4.5)
Glucose: 87 mg/dL (ref 65–99)
Potassium: 4.5 mmol/L (ref 3.5–5.2)
Sodium: 139 mmol/L (ref 134–144)
Total Protein: 7.6 g/dL (ref 6.0–8.5)

## 2018-06-16 LAB — CBC
Hematocrit: 42.8 % (ref 34.0–46.6)
Hemoglobin: 14.1 g/dL (ref 11.1–15.9)
MCH: 28.4 pg (ref 26.6–33.0)
MCHC: 32.9 g/dL (ref 31.5–35.7)
MCV: 86 fL (ref 79–97)
Platelets: 323 10*3/uL (ref 150–450)
RBC: 4.97 x10E6/uL (ref 3.77–5.28)
RDW: 12.3 % (ref 12.3–15.4)
WBC: 9.5 10*3/uL (ref 3.4–10.8)

## 2018-06-16 LAB — HEMOGLOBIN A1C
Est. average glucose Bld gHb Est-mCnc: 117 mg/dL
Hgb A1c MFr Bld: 5.7 % — ABNORMAL HIGH (ref 4.8–5.6)

## 2018-06-18 ENCOUNTER — Encounter: Payer: Self-pay | Admitting: Internal Medicine

## 2018-06-18 DIAGNOSIS — M722 Plantar fascial fibromatosis: Secondary | ICD-10-CM | POA: Insufficient documentation

## 2018-06-18 NOTE — Progress Notes (Signed)
Subjective:     Patient ID: Samantha Mcgee , female    DOB: Oct 17, 1965 , 52 y.o.   MRN: [id number]   Chief Complaint  Patient presents with  . Annual Exam    HPI  She is here today for a full physical exam.  She is followed by Dr. Garwin Brothers for her pap smears.     Past Medical History:  Diagnosis Date  . Anxiety   . Arthritis   . Asthma   . Headache   . History of kidney stones   . Interstitial cystitis   . Seasonal allergies      Family History  Problem Relation Age of Onset  . Eczema Brother   . Eczema Daughter   . Diabetes Mother   . Stroke Mother   . Hyperlipidemia Mother   . Arthritis Father   . Heart attack Father   . Hyperlipidemia Father   . Allergic rhinitis Neg Hx   . Angioedema Neg Hx   . Asthma Neg Hx   . Immunodeficiency Neg Hx   . Urticaria Neg Hx      Current Outpatient Medications:  .  albuterol (PROAIR HFA) 108 (90 Base) MCG/ACT inhaler, Use 2 puffs every 4 hours as needed for cough or wheeze.  May use 2 puffs 10-20 minutes prior to exercise., Disp: 1 Inhaler, Rfl: 1 .  amitriptyline (ELAVIL) 10 MG tablet, 3 tablets., Disp: , Rfl:  .  ASMANEX, 60 METERED DOSES, 220 MCG/INH inhaler, INHALE 1 PUFF INTO LUNGS BY MOUTH TWICE DAILY, Disp: 1 Inhaler, Rfl: 1 .  cetirizine (ZYRTEC) 10 MG tablet, TAKE 1 TABLET BY MOUTH EVERY DAY FOR RUNNY NOSE OR ITCHING (Patient taking differently: Take 10 mg by mouth daily. ), Disp: 90 tablet, Rfl: 1 .  Clindamycin-Benzoyl Per, Refr, gel, Apply 1 application topically daily., Disp: , Rfl: 3 .  ELMIRON 100 MG capsule, Take 1 capsule (100 mg total) by mouth 2 (two) times daily., Disp: 60 capsule, Rfl: 2 .  linaclotide (LINZESS) 145 MCG CAPS capsule, Take 1 capsule (145 mcg total) by mouth daily before breakfast., Disp: 90 capsule, Rfl: 2 .  montelukast (SINGULAIR) 10 MG tablet, Take 1 tablet (10 mg total) by mouth at bedtime., Disp: 30 tablet, Rfl: 5 .  Multiple Minerals-Vitamins (CAL MAG ZINC +D3 PO), Take 1 tablet by  mouth daily., Disp: , Rfl:  .  Omega-3 1000 MG CAPS, Take 1,000 mg by mouth daily., Disp: , Rfl:  .  Turmeric 500 MG CAPS, Take 500 mg by mouth daily., Disp: , Rfl:  .  vitamin B-12 (CYANOCOBALAMIN) 1000 MCG tablet, Take 1,000 mcg by mouth daily., Disp: , Rfl:  .  zolpidem (AMBIEN) 5 MG tablet, Take 5-10 mg by mouth at bedtime as needed for sleep. , Disp: , Rfl:  .  Probiotic Product (PROBIOTIC PO), Take 1 capsule by mouth 2 (two) times daily. ULTRA FLORA , Disp: , Rfl:  .  RETIN-A MICRO PUMP 0.08 % GEL, Apply 1 application topically at bedtime., Disp: , Rfl: 1   Allergies  Allergen Reactions  . Latex Other (See Comments)    Blisters.      Patient's last menstrual period was 03/06/2018.Marland KitchenShe has since had a hysterectomy Sept 2019.  Negative for: breast discharge, breast lump(s), breast pain and breast self exam. Associated symptoms include abnormal vaginal bleeding. Pertinent negatives include abnormal bleeding (hematology), anxiety, decreased libido, depression, difficulty falling sleep, dyspareunia, history of infertility, nocturia, sexual dysfunction, sleep disturbances, urinary incontinence, urinary urgency, vaginal  discharge and vaginal itching. Diet regular.The patient states her exercise level is    . The patient's tobacco use is:  Social History   Tobacco Use  Smoking Status Never Smoker  Smokeless Tobacco Never Used  . She has been exposed to passive smoke. The patient's alcohol use is:  Social History   Substance and Sexual Activity  Alcohol Use Yes  . Alcohol/week: 1.0 standard drinks  . Types: 1 Glasses of wine per week   Comment: weekly  . Additional information: Last pap early 2019 next one scheduled for 2020.   Review of Systems  Constitutional: Negative.   HENT: Negative.   Eyes: Negative.   Respiratory: Negative.   Cardiovascular: Negative.   Gastrointestinal: Negative.   Endocrine: Negative.   Genitourinary: Negative.   Musculoskeletal: Positive for  arthralgias (she c/o b/l foot pain. occurs when first getting out of bed in the am. denies trauma/fall. pain subsides as the day progresses).  Allergic/Immunologic: Negative.   Neurological: Negative.   Hematological: Negative.   Psychiatric/Behavioral: Positive for dysphoric mood (states she is having marital issues. Her husband does not wish to go to counseling. He is upset w/ travel she has to do for work. ).     Today's Vitals   06/15/18 0858  BP: 114/72  Pulse: 87  Temp: 98.6 F (37 C)  TempSrc: Oral  Weight: 187 lb 3.2 oz (84.9 kg)  Height: 5' 4.5" (1.638 m)   Body mass index is 31.64 kg/m.   Objective:  Physical Exam  Constitutional: She is oriented to person, place, and time. She appears well-developed and well-nourished.  HENT:  Head: Normocephalic and atraumatic.  Right Ear: External ear normal.  Left Ear: External ear normal.  Nose: Nose normal.  Mouth/Throat: Oropharynx is clear and moist. No oropharyngeal exudate.  Eyes: Pupils are equal, round, and reactive to light. Conjunctivae and EOM are normal.  Neck: Normal range of motion. Neck supple.  Cardiovascular: Normal rate, regular rhythm, normal heart sounds and intact distal pulses.  Pulmonary/Chest: Effort normal and breath sounds normal. Right breast exhibits no inverted nipple, no mass, no nipple discharge, no skin change and no tenderness. Left breast exhibits no inverted nipple, no mass, no nipple discharge, no skin change and no tenderness.  Abdominal: Soft. Bowel sounds are normal.  Genitourinary:  Genitourinary Comments: deferred  Musculoskeletal: Normal range of motion.  Neurological: She is alert and oriented to person, place, and time.  Skin: Skin is warm and dry.  Psychiatric: She has a normal mood and affect.  Nursing note and vitals reviewed.       Assessment And Plan:     1. Routine general medical examination at health care facility  A full exam was performed.  Importance of monthly self  breast exams was discussed with the patient.  PATIENT HAS BEEN ADVISED TO GET 30-45 MINUTES REGULAR EXERCISE NO LESS THAN FOUR TO FIVE DAYS PER WEEK - BOTH WEIGHTBEARING EXERCISES AND AEROBIC ARE RECOMMENDED.  SHE IS ADVISED TO FOLLOW A HEALTHY DIET WITH AT LEAST SIX FRUITS/VEGGIES PER DAY, DECREASE INTAKE OF RED MEAT, AND TO INCREASE FISH INTAKE TO TWO DAYS PER WEEK.  MEATS/FISH SHOULD NOT BE FRIED, BAKED OR BROILED IS PREFERABLE.  I SUGGEST WEARING SPF 50 SUNSCREEN ON EXPOSED PARTS AND ESPECIALLY WHEN IN THE DIRECT SUNLIGHT FOR AN EXTENDED PERIOD OF TIME.  PLEASE AVOID FAST FOOD RESTAURANTS AND INCREASE YOUR WATER INTAKE.  - CMP14+EGFR - CBC - Lipid panel - Hemoglobin A1c - POCT Urinalysis Dipstick (  81002)  2. Plantar fasciitis, bilateral  Her foot pain sx are suggestive of plantar fasciitis. She was given some stretching exercises to perform prior to getting out of bed and to perform during the day as needed. She will let me know if her sx persist.    3. Marital conflict  She is encouraged to seek counseling. She will check to see if her company has an EAP program. If not, I plan to refer her for therapy. She is in agreement with this plan.   Maximino Greenland, MD

## 2018-06-18 NOTE — Progress Notes (Signed)
Your liver and kidney fxn are nl. Your blood count is normal.  Your LDL, bad chol is 119  Ideally, this should be less than 100.  As you incorporate more exercise into your daily routine, this will improve. Your hba1c is 5.7, normal is 5.6. You are close! We will recheck this at your next physical.

## 2018-07-03 ENCOUNTER — Other Ambulatory Visit: Payer: Self-pay

## 2018-07-03 MED ORDER — AMITRIPTYLINE HCL 10 MG PO TABS: 30.0000 mg | ORAL_TABLET | Freq: Every evening | ORAL | 1 refills | Status: DC | PRN

## 2018-07-07 ENCOUNTER — Other Ambulatory Visit: Payer: Self-pay | Admitting: Internal Medicine

## 2018-07-07 DIAGNOSIS — Z1231 Encounter for screening mammogram for malignant neoplasm of breast: Secondary | ICD-10-CM

## 2018-07-12 ENCOUNTER — Other Ambulatory Visit: Payer: Self-pay | Admitting: Internal Medicine

## 2018-07-13 NOTE — Telephone Encounter (Signed)
Zolpidem refill

## 2018-07-19 DIAGNOSIS — M62838 Other muscle spasm: Secondary | ICD-10-CM | POA: Diagnosis not present

## 2018-07-19 DIAGNOSIS — M6281 Muscle weakness (generalized): Secondary | ICD-10-CM | POA: Diagnosis not present

## 2018-07-19 DIAGNOSIS — R102 Pelvic and perineal pain: Secondary | ICD-10-CM | POA: Diagnosis not present

## 2018-07-24 DIAGNOSIS — R102 Pelvic and perineal pain: Secondary | ICD-10-CM | POA: Diagnosis not present

## 2018-07-24 DIAGNOSIS — M62838 Other muscle spasm: Secondary | ICD-10-CM | POA: Diagnosis not present

## 2018-07-24 DIAGNOSIS — M6281 Muscle weakness (generalized): Secondary | ICD-10-CM | POA: Diagnosis not present

## 2018-08-02 ENCOUNTER — Other Ambulatory Visit: Payer: Self-pay | Admitting: Internal Medicine

## 2018-08-02 DIAGNOSIS — M62838 Other muscle spasm: Secondary | ICD-10-CM | POA: Diagnosis not present

## 2018-08-02 DIAGNOSIS — M6281 Muscle weakness (generalized): Secondary | ICD-10-CM | POA: Diagnosis not present

## 2018-08-02 DIAGNOSIS — R102 Pelvic and perineal pain: Secondary | ICD-10-CM | POA: Diagnosis not present

## 2018-08-09 ENCOUNTER — Ambulatory Visit
Admission: RE | Admit: 2018-08-09 | Discharge: 2018-08-09 | Disposition: A | Discharge status: Home / Self Care | Payer: 59 | Source: Ambulatory Visit | Attending: Internal Medicine | Admitting: Internal Medicine

## 2018-08-09 DIAGNOSIS — Z1231 Encounter for screening mammogram for malignant neoplasm of breast: Secondary | ICD-10-CM | POA: Diagnosis not present

## 2018-09-05 ENCOUNTER — Other Ambulatory Visit: Payer: Self-pay | Admitting: Internal Medicine

## 2018-09-14 ENCOUNTER — Other Ambulatory Visit: Payer: Self-pay | Admitting: Internal Medicine

## 2018-10-10 ENCOUNTER — Other Ambulatory Visit: Payer: Self-pay | Admitting: Internal Medicine

## 2018-11-16 ENCOUNTER — Other Ambulatory Visit: Payer: Self-pay | Admitting: Internal Medicine

## 2018-12-14 ENCOUNTER — Other Ambulatory Visit: Payer: Self-pay | Admitting: Internal Medicine

## 2019-01-31 ENCOUNTER — Other Ambulatory Visit: Payer: Self-pay | Admitting: Internal Medicine

## 2019-02-01 NOTE — Telephone Encounter (Signed)
Zolpidem refill

## 2019-03-16 ENCOUNTER — Other Ambulatory Visit: Payer: Self-pay | Admitting: Internal Medicine

## 2019-03-26 ENCOUNTER — Other Ambulatory Visit: Payer: Self-pay

## 2019-03-26 MED ORDER — AMITRIPTYLINE HCL 10 MG PO TABS: 10.0000 mg | ORAL_TABLET | Freq: Every evening | ORAL | 1 refills | Status: DC | PRN

## 2019-05-29 ENCOUNTER — Encounter: Payer: Self-pay | Admitting: Internal Medicine

## 2019-05-29 ENCOUNTER — Ambulatory Visit (INDEPENDENT_AMBULATORY_CARE_PROVIDER_SITE_OTHER): Payer: 59 | Admitting: Internal Medicine

## 2019-05-29 ENCOUNTER — Other Ambulatory Visit: Payer: Self-pay

## 2019-05-29 VITALS — BP 112/70 | HR 90 | Temp 98.3°F | Ht 64.5 in | Wt 194.4 lb

## 2019-05-29 DIAGNOSIS — M7122 Synovial cyst of popliteal space [Baker], left knee: Secondary | ICD-10-CM | POA: Diagnosis not present

## 2019-05-29 DIAGNOSIS — M79605 Pain in left leg: Secondary | ICD-10-CM

## 2019-05-29 DIAGNOSIS — M79604 Pain in right leg: Secondary | ICD-10-CM

## 2019-05-29 DIAGNOSIS — Z630 Problems in relationship with spouse or partner: Secondary | ICD-10-CM | POA: Diagnosis not present

## 2019-05-29 NOTE — Patient Instructions (Signed)
Baker Cyst A Baker cyst, also called a popliteal cyst, is a sac-like growth that forms at the back of the knee. The cyst forms when the fluid-filled sac (bursa) that cushions the knee joint becomes enlarged. The bursa that becomes a Baker cyst is located at the back of the knee joint. What are the causes? In most cases, a Baker cyst results from another knee problem that causes swelling inside the knee. This makes the fluid inside the knee joint (synovial fluid) flow into the bursa behind the knee, causing the bursa to enlarge. What increases the risk? You may be more likely to develop a Baker cyst if you already have a knee problem, such as:  A tear in cartilage that cushions the knee joint (meniscal tear).  A tear in the tissues that connect the bones of the knee joint (ligament tear).  Knee swelling from osteoarthritis, rheumatoid arthritis, or gout. What are the signs or symptoms? A Baker cyst does not always cause symptoms. A lump behind the knee may be the only sign of the condition. The lump may be painful, especially when the knee is straightened. If the lump is painful, the pain may come and go. The knee may also be stiff. Symptoms may quickly get more severe if the cyst breaks open (ruptures). If your cyst ruptures, signs and symptoms may affect the knee and the back of the lower leg (calf) and may include:  Sudden or worsening pain.  Swelling.  Bruising. How is this diagnosed? This condition may be diagnosed based on your symptoms and medical history. Your health care provider will also do a physical exam. This may include:  Feeling the cyst to check whether it is tender.  Checking your knee for signs of another knee condition that causes swelling. You may have imaging tests, such as:  X-rays.  MRI.  Ultrasound. How is this treated? A Baker cyst that is not painful may go away without treatment. If the cyst gets large or painful, it will likely get better if the  underlying knee problem is treated. Treatment for a Baker cyst may include:  Resting.  Keeping weight off of the knee. This means not leaning on the knee to support your body weight.  NSAIDs to reduce pain and swelling.  A procedure to drain the fluid from the cyst with a needle (aspiration). You may also get an injection of a medicine that reduces swelling (steroid).  Surgery. This may be needed if other treatments do not work. This usually involves correcting knee damage and removing the cyst. Follow these instructions at home:   Take over-the-counter and prescription medicines only as told by your health care provider.  Rest and return to your normal activities as told by your health care provider. Avoid activities that make pain or swelling worse. Ask your health care provider what activities are safe for you.  Keep all follow-up visits as told by your health care provider. This is important. Contact a health care provider if:  You have knee pain, stiffness, or swelling that does not get better. Get help right away if:  You have sudden or worsening pain and swelling in your calf area. This information is not intended to replace advice given to you by your health care provider. Make sure you discuss any questions you have with your health care provider. Document Released: 06/28/2005 Document Revised: 06/10/2017 Document Reviewed: 03/18/2016 Elsevier Patient Education  2020 Elsevier Inc.  

## 2019-06-09 NOTE — Progress Notes (Signed)
Subjective:     Patient ID: Samantha Mcgee , female    DOB: Jun 18, 1966 , 53 y.o.   MRN: [id number]   Chief Complaint  Patient presents with  . Leg Pain    bilateral   . Cyst    left leg    HPI  She is here today for further evaluation of bilateral leg pain.  She is unable to determine what triggers her symptoms. She denies fall/trauma. Also has pain behind left knee.  Has h/o Baker's cyst. She is not sure what may have caused this to flare. She does admit to walking on a regular basis for exercise.    Leg Pain  The incident occurred more than 1 week ago. There was no injury mechanism. The pain is present in the left leg and right leg. The pain is at a severity of 5/10. The pain is moderate. The pain has been fluctuating since onset. Pertinent negatives include no inability to bear weight, loss of sensation, numbness or tingling. She reports no foreign bodies present. The symptoms are aggravated by movement. She has tried nothing for the symptoms.     Past Medical History:  Diagnosis Date  . Anxiety   . Arthritis   . Asthma   . Headache   . History of kidney stones   . Interstitial cystitis   . Seasonal allergies      Family History  Problem Relation Age of Onset  . Eczema Brother   . Eczema Daughter   . Diabetes Mother   . Stroke Mother   . Hyperlipidemia Mother   . Arthritis Father   . Heart attack Father   . Hyperlipidemia Father   . Allergic rhinitis Neg Hx   . Angioedema Neg Hx   . Asthma Neg Hx   . Immunodeficiency Neg Hx   . Urticaria Neg Hx      Current Outpatient Medications:  .  albuterol (PROAIR HFA) 108 (90 Base) MCG/ACT inhaler, Use 2 puffs every 4 hours as needed for cough or wheeze.  May use 2 puffs 10-20 minutes prior to exercise., Disp: 1 Inhaler, Rfl: 1 .  amitriptyline (ELAVIL) 10 MG tablet, Take 1 tablet (10 mg total) by mouth at bedtime as needed for sleep. Take 2-3 tablets as needed at bedtime., Disp: 180 tablet, Rfl: 1 .  ARNUITY ELLIPTA  200 MCG/ACT AEPB, Inhale 1 puff into the lungs daily., Disp: , Rfl:  .  CALCIUM PO, Take by mouth., Disp: , Rfl:  .  cetirizine (ZYRTEC) 10 MG tablet, TAKE 1 TABLET BY MOUTH EVERY DAY FOR RUNNY NOSE OR ITCHING (Patient taking differently: Take 10 mg by mouth daily. ), Disp: 90 tablet, Rfl: 1 .  Clindamycin-Benzoyl Per, Refr, gel, Apply 1 application topically daily., Disp: , Rfl: 3 .  ELMIRON 100 MG capsule, TAKE 1 CAPSULE BY MOUTH TWICE A DAY, Disp: 180 capsule, Rfl: 0 .  IBUPROFEN PO, Take 800 mg by mouth., Disp: , Rfl:  .  MAGNESIUM PO, Take by mouth., Disp: , Rfl:  .  montelukast (SINGULAIR) 10 MG tablet, Take 1 tablet (10 mg total) by mouth at bedtime., Disp: 30 tablet, Rfl: 5 .  NON FORMULARY, cbd, Disp: , Rfl:  .  Omega-3 1000 MG CAPS, Take 1,000 mg by mouth daily., Disp: , Rfl:  .  polyethylene glycol (MIRALAX) 17 g packet, Take 17 g by mouth daily. Every 2 - 3 days prn, Disp: , Rfl:  .  Probiotic Product (PROBIOTIC PO), Take 1 capsule  by mouth 2 (two) times daily. ULTRA FLORA , Disp: , Rfl:  .  RETIN-A MICRO PUMP 0.08 % GEL, Apply 1 application topically at bedtime., Disp: , Rfl: 1 .  Turmeric 500 MG CAPS, Take 500 mg by mouth daily., Disp: , Rfl:  .  vitamin B-12 (CYANOCOBALAMIN) 1000 MCG tablet, Take 1,000 mcg by mouth daily., Disp: , Rfl:  .  zolpidem (AMBIEN) 5 MG tablet, TAKE 1 TABLET BY MOUTH EVERY DAY AT BEDTIME AS NEEDED, Disp: 30 tablet, Rfl: 1   Allergies  Allergen Reactions  . Latex Other (See Comments)    Blisters.     Review of Systems  Constitutional: Negative.   Respiratory: Negative.   Cardiovascular: Negative.   Gastrointestinal: Negative.   Neurological: Negative.  Negative for tingling and numbness.  Psychiatric/Behavioral: Positive for dysphoric mood.       She is still experiencing marital stress. She feels she and her husband are just roommates. There is little communication between the two of them. She states he is unwilling to go to counseling.       Today's Vitals   05/29/19 1512  BP: 112/70  Pulse: 90  Temp: 98.3 F (36.8 C)  TempSrc: Oral  Weight: 194 lb 6.4 oz (88.2 kg)  Height: 5' 4.5" (1.638 m)  PainSc: 6   PainLoc: Leg   Body mass index is 32.85 kg/m.   Objective:  Physical Exam Vitals signs and nursing note reviewed.  Constitutional:      Appearance: Normal appearance.  HENT:     Head: Normocephalic and atraumatic.  Cardiovascular:     Rate and Rhythm: Normal rate and regular rhythm.     Heart sounds: Normal heart sounds.  Pulmonary:     Effort: Pulmonary effort is normal.     Breath sounds: Normal breath sounds.  Musculoskeletal:        General: No swelling or tenderness.     Right lower leg: No edema.     Left lower leg: No edema.     Comments: There is some swelling behind left knee. There is no overlying erythema.   Skin:    General: Skin is warm.  Neurological:     General: No focal deficit present.     Mental Status: She is alert.  Psychiatric:        Mood and Affect: Mood normal.        Behavior: Behavior normal.         Assessment And Plan:     1. Bilateral leg pain  Possibly due to muscular strain. Advised to stretch after exercising daily. Also encouraged to stay well hydrated. She will let me know if her sx persist.   2. Baker cyst, left  Chronic. Advised to apply topical pain cream to affected area two to three times daily. She is advised to try Voltaren Gel that can be purchased OTC.   3. Marital conflict  Persistent. She is encouraged to consider counseling for herself, even if her husband does not agree to participate. Greater than 50% of face to face time was spent in counseling and coordination of care. I spent 25 minutes with the patient discussing current marital concerns.  Maximino Greenland, MD    THE PATIENT IS ENCOURAGED TO PRACTICE SOCIAL DISTANCING DUE TO THE COVID-19 PANDEMIC.

## 2019-06-12 ENCOUNTER — Other Ambulatory Visit: Payer: Self-pay | Admitting: Internal Medicine

## 2019-06-21 ENCOUNTER — Encounter: Payer: Self-pay | Admitting: Internal Medicine

## 2019-06-21 ENCOUNTER — Ambulatory Visit (INDEPENDENT_AMBULATORY_CARE_PROVIDER_SITE_OTHER): Payer: 59 | Admitting: Internal Medicine

## 2019-06-21 ENCOUNTER — Other Ambulatory Visit: Payer: Self-pay

## 2019-06-21 VITALS — BP 116/70 | HR 92 | Temp 98.3°F | Ht 64.4 in | Wt 192.6 lb

## 2019-06-21 DIAGNOSIS — Z0000 Encounter for general adult medical examination without abnormal findings: Secondary | ICD-10-CM

## 2019-06-21 LAB — POCT URINALYSIS DIPSTICK
Bilirubin, UA: NEGATIVE
Glucose, UA: NEGATIVE
Ketones, UA: NEGATIVE
Leukocytes, UA: NEGATIVE
Nitrite, UA: NEGATIVE
Protein, UA: NEGATIVE
Spec Grav, UA: 1.025 (ref 1.010–1.025)
Urobilinogen, UA: 0.2 E.U./dL
pH, UA: 5.5 (ref 5.0–8.0)

## 2019-06-21 MED ORDER — ZOLPIDEM TARTRATE 5 MG PO TABS: ORAL_TABLET | ORAL | 1 refills | Status: DC

## 2019-06-21 NOTE — Progress Notes (Signed)
This visit occurred during the SARS-CoV-2 public health emergency.  Safety protocols were in place, including screening questions prior to the visit, additional usage of staff PPE, and extensive cleaning of exam room while observing appropriate contact time as indicated for disinfecting solutions.  Subjective:     Patient ID: Samantha Mcgee , female    DOB: October 13, 1965 , 53 y.o.   MRN: [id number]   Chief Complaint  Patient presents with  . Annual Exam    HPI  She is here today for a full physical examination. She is followed by Dr. Garwin Brothers for her GYN care. She was last seen in Sept 2020.     Past Medical History:  Diagnosis Date  . Anxiety   . Arthritis   . Asthma   . Headache   . History of kidney stones   . Interstitial cystitis   . Seasonal allergies      Family History  Problem Relation Age of Onset  . Eczema Brother   . Eczema Daughter   . Diabetes Mother   . Stroke Mother   . Hyperlipidemia Mother   . Arthritis Father   . Heart attack Father   . Hyperlipidemia Father   . Allergic rhinitis Neg Hx   . Angioedema Neg Hx   . Asthma Neg Hx   . Immunodeficiency Neg Hx   . Urticaria Neg Hx      Current Outpatient Medications:  .  albuterol (PROAIR HFA) 108 (90 Base) MCG/ACT inhaler, Use 2 puffs every 4 hours as needed for cough or wheeze.  May use 2 puffs 10-20 minutes prior to exercise., Disp: 1 Inhaler, Rfl: 1 .  amitriptyline (ELAVIL) 10 MG tablet, Take 1 tablet (10 mg total) by mouth at bedtime as needed for sleep. Take 2-3 tablets as needed at bedtime., Disp: 180 tablet, Rfl: 1 .  ARNUITY ELLIPTA 200 MCG/ACT AEPB, Inhale 1 puff into the lungs daily., Disp: , Rfl:  .  CALCIUM PO, Take by mouth., Disp: , Rfl:  .  cetirizine (ZYRTEC) 10 MG tablet, TAKE 1 TABLET BY MOUTH EVERY DAY FOR RUNNY NOSE OR ITCHING (Patient taking differently: Take 10 mg by mouth daily. ), Disp: 90 tablet, Rfl: 1 .  Clindamycin-Benzoyl Per, Refr, gel, Apply 1 application topically  daily., Disp: , Rfl: 3 .  ELMIRON 100 MG capsule, TAKE 1 CAPSULE BY MOUTH TWICE A DAY, Disp: 180 capsule, Rfl: 0 .  IBUPROFEN PO, Take 800 mg by mouth., Disp: , Rfl:  .  MAGNESIUM PO, Take by mouth., Disp: , Rfl:  .  montelukast (SINGULAIR) 10 MG tablet, Take 1 tablet (10 mg total) by mouth at bedtime., Disp: 30 tablet, Rfl: 5 .  NON FORMULARY, cbd, Disp: , Rfl:  .  Omega-3 1000 MG CAPS, Take 1,000 mg by mouth daily., Disp: , Rfl:  .  polyethylene glycol (MIRALAX) 17 g packet, Take 17 g by mouth daily. Every 2 - 3 days prn, Disp: , Rfl:  .  Probiotic Product (PROBIOTIC PO), Take 1 capsule by mouth 2 (two) times daily. ULTRA FLORA , Disp: , Rfl:  .  RETIN-A MICRO PUMP 0.08 % GEL, Apply 1 application topically at bedtime., Disp: , Rfl: 1 .  Turmeric 500 MG CAPS, Take 500 mg by mouth daily., Disp: , Rfl:  .  vitamin B-12 (CYANOCOBALAMIN) 500 MCG tablet, Take 500 mcg by mouth daily. , Disp: , Rfl:  .  zolpidem (AMBIEN) 5 MG tablet, TAKE 1 TABLET BY MOUTH EVERY DAY AT BEDTIME  AS NEEDED, Disp: 30 tablet, Rfl: 1   Allergies  Allergen Reactions  . Latex Other (See Comments)    Blisters.     The patient states she uses none for birth control. Last LMP was Patient's last menstrual period was 03/06/2018.. Negative for Dysmenorrhea Negative for: breast discharge, breast lump(s), breast pain and breast self exam. Associated symptoms include abnormal vaginal bleeding. Pertinent negatives include abnormal bleeding (hematology), anxiety, decreased libido, depression, difficulty falling sleep, dyspareunia, history of infertility, nocturia, sexual dysfunction, sleep disturbances, urinary incontinence, urinary urgency, vaginal discharge and vaginal itching. Diet regular.The patient states her exercise level is  moderate - she tries to walk regularly.   . The patient's tobacco use is:  Social History   Tobacco Use  Smoking Status Never Smoker  Smokeless Tobacco Never Used  . She has been exposed to passive  smoke. The patient's alcohol use is:  Social History   Substance and Sexual Activity  Alcohol Use Yes  . Alcohol/week: 1.0 standard drinks  . Types: 1 Glasses of wine per week   Comment: weekly   Review of Systems  Constitutional: Negative.   HENT: Negative.   Eyes: Negative.   Respiratory: Negative.   Cardiovascular: Negative.   Endocrine: Negative.   Genitourinary: Negative.   Musculoskeletal: Negative.   Skin: Negative.   Allergic/Immunologic: Negative.   Neurological: Negative.   Hematological: Negative.   Psychiatric/Behavioral: Negative.      Today's Vitals   06/21/19 0854  BP: 116/70  Pulse: 92  Temp: 98.3 F (36.8 C)  TempSrc: Oral  Weight: 192 lb 9.6 oz (87.4 kg)  Height: 5' 4.4" (1.636 m)   Body mass index is 32.65 kg/m.   Objective:  Physical Exam Vitals and nursing note reviewed.  Constitutional:      Appearance: Normal appearance.  HENT:     Head: Normocephalic and atraumatic.     Right Ear: Tympanic membrane, ear canal and external ear normal.     Left Ear: Tympanic membrane, ear canal and external ear normal.     Nose: Nose normal.     Mouth/Throat:     Mouth: Mucous membranes are moist.     Pharynx: Oropharynx is clear.  Eyes:     Extraocular Movements: Extraocular movements intact.     Conjunctiva/sclera: Conjunctivae normal.     Pupils: Pupils are equal, round, and reactive to light.  Cardiovascular:     Rate and Rhythm: Normal rate and regular rhythm.     Pulses: Normal pulses.     Heart sounds: Normal heart sounds.  Pulmonary:     Effort: Pulmonary effort is normal.     Breath sounds: Normal breath sounds.  Chest:     Breasts: Tanner Score is 5.        Right: Normal.        Left: Normal.  Abdominal:     General: Abdomen is flat. Bowel sounds are normal.     Palpations: Abdomen is soft.  Genitourinary:    Comments: deferred Musculoskeletal:        General: Normal range of motion.     Cervical back: Normal range of motion and  neck supple.  Skin:    General: Skin is warm and dry.  Neurological:     General: No focal deficit present.     Mental Status: She is alert and oriented to person, place, and time.  Psychiatric:        Mood and Affect: Mood normal.  Behavior: Behavior normal.         Assessment And Plan:     1. Routine general medical examination at health care facility  A full exam was performed.  Importance of monthly self breast exams was discussed with the patient. PATIENT IS ADVISED TO GET 30-45 MINUTES REGULAR EXERCISE NO LESS THAN FOUR TO FIVE DAYS PER WEEK - BOTH WEIGHTBEARING EXERCISES AND AEROBIC ARE RECOMMENDED.  SHE IS ADVISED TO FOLLOW A HEALTHY DIET WITH AT LEAST SIX FRUITS/VEGGIES PER DAY, DECREASE INTAKE OF RED MEAT, AND TO INCREASE FISH INTAKE TO TWO DAYS PER WEEK.  MEATS/FISH SHOULD NOT BE FRIED, BAKED OR BROILED IS PREFERABLE.  I SUGGEST WEARING SPF 50 SUNSCREEN ON EXPOSED PARTS AND ESPECIALLY WHEN IN THE DIRECT SUNLIGHT FOR AN EXTENDED PERIOD OF TIME.  PLEASE AVOID FAST FOOD RESTAURANTS AND INCREASE YOUR WATER INTAKE.  - CMP14+EGFR - CBC - Lipid panel - Hemoglobin A1c - POCT Urinalysis Dipstick (81002)   Maximino Greenland, MD    THE PATIENT IS ENCOURAGED TO PRACTICE SOCIAL DISTANCING DUE TO THE COVID-19 PANDEMIC.

## 2019-06-21 NOTE — Patient Instructions (Signed)
Health Maintenance, Female Adopting a healthy lifestyle and getting preventive care are important in promoting health and wellness. Ask your health care provider about:  The right schedule for you to have regular tests and exams.  Things you can do on your own to prevent diseases and keep yourself healthy. What should I know about diet, weight, and exercise? Eat a healthy diet   Eat a diet that includes plenty of vegetables, fruits, low-fat dairy products, and lean protein.  Do not eat a lot of foods that are high in solid fats, added sugars, or sodium. Maintain a healthy weight Body mass index (BMI) is used to identify weight problems. It estimates body fat based on height and weight. Your health care provider can help determine your BMI and help you achieve or maintain a healthy weight. Get regular exercise Get regular exercise. This is one of the most important things you can do for your health. Most adults should:  Exercise for at least 150 minutes each week. The exercise should increase your heart rate and make you sweat (moderate-intensity exercise).  Do strengthening exercises at least twice a week. This is in addition to the moderate-intensity exercise.  Spend less time sitting. Even light physical activity can be beneficial. Watch cholesterol and blood lipids Have your blood tested for lipids and cholesterol at 52 years of age, then have this test every 5 years. Have your cholesterol levels checked more often if:  Your lipid or cholesterol levels are high.  You are older than 52 years of age.  You are at high risk for heart disease. What should I know about cancer screening? Depending on your health history and family history, you may need to have cancer screening at various ages. This may include screening for:  Breast cancer.  Cervical cancer.  Colorectal cancer.  Skin cancer.  Lung cancer. What should I know about heart disease, diabetes, and high blood  pressure? Blood pressure and heart disease  High blood pressure causes heart disease and increases the risk of stroke. This is more likely to develop in people who have high blood pressure readings, are of African descent, or are overweight.  Have your blood pressure checked: ? Every 3-5 years if you are 18-39 years of age. ? Every year if you are 40 years old or older. Diabetes Have regular diabetes screenings. This checks your fasting blood sugar level. Have the screening done:  Once every three years after age 40 if you are at a normal weight and have a low risk for diabetes.  More often and at a younger age if you are overweight or have a high risk for diabetes. What should I know about preventing infection? Hepatitis B If you have a higher risk for hepatitis B, you should be screened for this virus. Talk with your health care provider to find out if you are at risk for hepatitis B infection. Hepatitis C Testing is recommended for:  Everyone born from 1945 through 1965.  Anyone with known risk factors for hepatitis C. Sexually transmitted infections (STIs)  Get screened for STIs, including gonorrhea and chlamydia, if: ? You are sexually active and are younger than 52 years of age. ? You are older than 52 years of age and your health care provider tells you that you are at risk for this type of infection. ? Your sexual activity has changed since you were last screened, and you are at increased risk for chlamydia or gonorrhea. Ask your health care provider if   you are at risk.  Ask your health care provider about whether you are at high risk for HIV. Your health care provider may recommend a prescription medicine to help prevent HIV infection. If you choose to take medicine to prevent HIV, you should first get tested for HIV. You should then be tested every 3 months for as long as you are taking the medicine. Pregnancy  If you are about to stop having your period (premenopausal) and  you may become pregnant, seek counseling before you get pregnant.  Take 400 to 800 micrograms (mcg) of folic acid every day if you become pregnant.  Ask for birth control (contraception) if you want to prevent pregnancy. Osteoporosis and menopause Osteoporosis is a disease in which the bones lose minerals and strength with aging. This can result in bone fractures. If you are 65 years old or older, or if you are at risk for osteoporosis and fractures, ask your health care provider if you should:  Be screened for bone loss.  Take a calcium or vitamin D supplement to lower your risk of fractures.  Be given hormone replacement therapy (HRT) to treat symptoms of menopause. Follow these instructions at home: Lifestyle  Do not use any products that contain nicotine or tobacco, such as cigarettes, e-cigarettes, and chewing tobacco. If you need help quitting, ask your health care provider.  Do not use street drugs.  Do not share needles.  Ask your health care provider for help if you need support or information about quitting drugs. Alcohol use  Do not drink alcohol if: ? Your health care provider tells you not to drink. ? You are pregnant, may be pregnant, or are planning to become pregnant.  If you drink alcohol: ? Limit how much you use to 0-1 drink a day. ? Limit intake if you are breastfeeding.  Be aware of how much alcohol is in your drink. In the [place] one drink equals one 12 oz bottle of beer (355 mL), one 5 oz glass of wine (148 mL), or one 1 oz glass of hard liquor (44 mL). General instructions  Schedule regular health, dental, and eye exams.  Stay current with your vaccines.  Tell your health care provider if: ? You often feel depressed. ? You have ever been abused or do not feel safe at home. Summary  Adopting a healthy lifestyle and getting preventive care are important in promoting health and wellness.  Follow your health care provider's instructions about healthy  diet, exercising, and getting tested or screened for diseases.  Follow your health care provider's instructions on monitoring your cholesterol and blood pressure. This information is not intended to replace advice given to you by your health care provider. Make sure you discuss any questions you have with your health care provider. Document Released: 01/11/2011 Document Revised: 06/21/2018 Document Reviewed: 06/21/2018 Elsevier Patient Education  2020 [employer] Inc[employer]  

## 2019-06-22 LAB — CMP14+EGFR
ALT: 14 IU/L (ref 0–32)
AST: 13 IU/L (ref 0–40)
Albumin/Globulin Ratio: 1.5 (ref 1.2–2.2)
Albumin: 4.1 g/dL (ref 3.8–4.9)
Alkaline Phosphatase: 91 IU/L (ref 39–117)
BUN/Creatinine Ratio: 12 (ref 9–23)
BUN: 10 mg/dL (ref 6–24)
Bilirubin Total: <0.2 mg/dL (ref 0.0–1.2)
CO2: 21 mmol/L (ref 20–29)
Calcium: 9.5 mg/dL (ref 8.7–10.2)
Chloride: 103 mmol/L (ref 96–106)
Creatinine, Ser: 0.86 mg/dL (ref 0.57–1.00)
GFR calc Af Amer: 90 mL/min/{1.73_m2} (ref >59)
GFR calc non Af Amer: 78 mL/min/{1.73_m2} (ref >59)
Globulin, Total: 2.8 g/dL (ref 1.5–4.5)
Glucose: 94 mg/dL (ref 65–99)
Potassium: 4.5 mmol/L (ref 3.5–5.2)
Sodium: 139 mmol/L (ref 134–144)
Total Protein: 6.9 g/dL (ref 6.0–8.5)

## 2019-06-22 LAB — CBC
Hematocrit: 42.8 % (ref 34.0–46.6)
Hemoglobin: 14 g/dL (ref 11.1–15.9)
MCH: 28.4 pg (ref 26.6–33.0)
MCHC: 32.7 g/dL (ref 31.5–35.7)
MCV: 87 fL (ref 79–97)
Platelets: 329 10*3/uL (ref 150–450)
RBC: 4.93 x10E6/uL (ref 3.77–5.28)
RDW: 12 % (ref 11.7–15.4)
WBC: 7.8 10*3/uL (ref 3.4–10.8)

## 2019-06-22 LAB — LIPID PANEL
Chol/HDL Ratio: 3.1 ratio (ref 0.0–4.4)
Cholesterol, Total: 173 mg/dL (ref 100–199)
HDL: 56 mg/dL (ref >39)
LDL Chol Calc (NIH): 107 mg/dL — ABNORMAL HIGH (ref 0–99)
Triglycerides: 48 mg/dL (ref 0–149)
VLDL Cholesterol Cal: 10 mg/dL (ref 5–40)

## 2019-06-22 LAB — HEMOGLOBIN A1C
Est. average glucose Bld gHb Est-mCnc: 117 mg/dL
Hgb A1c MFr Bld: 5.7 % — ABNORMAL HIGH (ref 4.8–5.6)

## 2019-06-30 ENCOUNTER — Other Ambulatory Visit: Payer: Self-pay | Admitting: Internal Medicine

## 2019-07-04 ENCOUNTER — Other Ambulatory Visit: Payer: Self-pay | Admitting: Internal Medicine

## 2019-07-10 ENCOUNTER — Other Ambulatory Visit: Payer: Self-pay | Admitting: Internal Medicine

## 2019-07-10 DIAGNOSIS — Z1231 Encounter for screening mammogram for malignant neoplasm of breast: Secondary | ICD-10-CM

## 2019-08-21 ENCOUNTER — Ambulatory Visit
Admission: RE | Admit: 2019-08-21 | Discharge: 2019-08-21 | Disposition: A | Discharge status: Home / Self Care | Payer: 59 | Source: Ambulatory Visit | Attending: Internal Medicine | Admitting: Internal Medicine

## 2019-08-21 ENCOUNTER — Other Ambulatory Visit: Payer: Self-pay

## 2019-08-21 DIAGNOSIS — Z1231 Encounter for screening mammogram for malignant neoplasm of breast: Secondary | ICD-10-CM

## 2019-08-24 ENCOUNTER — Other Ambulatory Visit: Payer: Self-pay | Admitting: Internal Medicine

## 2019-08-24 DIAGNOSIS — R928 Other abnormal and inconclusive findings on diagnostic imaging of breast: Secondary | ICD-10-CM

## 2019-09-05 ENCOUNTER — Ambulatory Visit
Admission: RE | Admit: 2019-09-05 | Discharge: 2019-09-05 | Disposition: A | Discharge status: Home / Self Care | Payer: 59 | Source: Ambulatory Visit | Attending: Internal Medicine | Admitting: Internal Medicine

## 2019-09-05 ENCOUNTER — Other Ambulatory Visit: Payer: Self-pay | Admitting: Internal Medicine

## 2019-09-05 ENCOUNTER — Other Ambulatory Visit: Payer: Self-pay

## 2019-09-05 DIAGNOSIS — R928 Other abnormal and inconclusive findings on diagnostic imaging of breast: Secondary | ICD-10-CM

## 2019-09-05 DIAGNOSIS — N6489 Other specified disorders of breast: Secondary | ICD-10-CM

## 2019-09-11 ENCOUNTER — Ambulatory Visit
Admission: RE | Admit: 2019-09-11 | Discharge: 2019-09-11 | Disposition: A | Discharge status: Home / Self Care | Payer: 59 | Source: Ambulatory Visit | Attending: Internal Medicine | Admitting: Internal Medicine

## 2019-09-11 ENCOUNTER — Other Ambulatory Visit: Payer: Self-pay

## 2019-09-11 ENCOUNTER — Other Ambulatory Visit: Payer: Self-pay | Admitting: Internal Medicine

## 2019-09-11 DIAGNOSIS — N6489 Other specified disorders of breast: Secondary | ICD-10-CM

## 2019-09-11 HISTORY — PX: BREAST BIOPSY: SHX20

## 2019-09-26 ENCOUNTER — Other Ambulatory Visit: Payer: Self-pay | Admitting: Internal Medicine

## 2019-10-16 ENCOUNTER — Other Ambulatory Visit: Payer: Self-pay | Admitting: Internal Medicine

## 2019-10-23 ENCOUNTER — Other Ambulatory Visit: Payer: Self-pay | Admitting: Internal Medicine

## 2019-10-24 NOTE — Telephone Encounter (Signed)
Ambien schedule for June

## 2019-11-26 ENCOUNTER — Ambulatory Visit: Payer: 59 | Admitting: Internal Medicine

## 2019-12-03 ENCOUNTER — Telehealth: Payer: Self-pay

## 2019-12-03 NOTE — Telephone Encounter (Signed)
Called the pt to inform her that her insurance will not cover Elmiron even with PA. Pt should schedule to see her urologist to get medication changed

## 2019-12-26 ENCOUNTER — Encounter: Payer: Self-pay | Admitting: Internal Medicine

## 2019-12-26 ENCOUNTER — Other Ambulatory Visit: Payer: Self-pay

## 2019-12-26 ENCOUNTER — Ambulatory Visit (INDEPENDENT_AMBULATORY_CARE_PROVIDER_SITE_OTHER): Payer: 59 | Admitting: Internal Medicine

## 2019-12-26 VITALS — BP 118/76 | HR 85 | Temp 98.7°F | Ht 64.4 in | Wt 196.4 lb

## 2019-12-26 DIAGNOSIS — G4700 Insomnia, unspecified: Secondary | ICD-10-CM

## 2019-12-26 DIAGNOSIS — Z87442 Personal history of urinary calculi: Secondary | ICD-10-CM

## 2019-12-26 DIAGNOSIS — R109 Unspecified abdominal pain: Secondary | ICD-10-CM

## 2019-12-26 MED ORDER — ZOLPIDEM TARTRATE 5 MG PO TABS: ORAL_TABLET | ORAL | 1 refills | Status: DC

## 2019-12-26 NOTE — Patient Instructions (Signed)
[facility] - lidocaine patches   Kidney Stones Kidney stones are rock-like masses that form inside of the kidneys. Kidneys are organs that make pee (urine). A kidney stone may move into other parts of the urinary tract, including:  The tubes that connect the kidneys to the bladder (ureters).  The bladder.  The tube that carries urine out of the body (urethra). Kidney stones can cause very bad pain and can block the flow of pee. The stone usually leaves your body (passes) through your pee. You may need to have a doctor take out the stone. What are the causes? Kidney stones may be caused by:  A condition in which certain glands make too much parathyroid hormone (primary hyperparathyroidism).  A buildup of a type of crystals in the bladder made of a chemical called uric acid. The body makes uric acid when you eat certain foods.  Narrowing (stricture) of one or both of the ureters.  A kidney blockage that you were born with.  Past surgery on the kidney or the ureters, such as gastric bypass surgery. What increases the risk? You are more likely to develop this condition if:  You have had a kidney stone in the past.  You have a family history of kidney stones.  You do not drink enough water.  You eat a diet that is high in protein, salt (sodium), or sugar.  You are overweight or very overweight (obese). What are the signs or symptoms? Symptoms of a kidney stone may include:  Pain in the side of the belly, right below the ribs (flank pain). Pain usually spreads (radiates) to the groin.  Needing to pee often or right away (urgently).  Pain when going pee (urinating).  Blood in your pee (hematuria).  Feeling like you may vomit (nauseous).  Vomiting.  Fever and chills. How is this treated? Treatment depends on the size, location, and makeup of the kidney stones. The stones will often pass out of the body through peeing. You may need to:  Drink more fluid to help pass the  stone. In some cases, you may be given fluids through an IV tube put into one of your veins at the hospital.  Take medicine for pain.  Make changes in your diet to help keep kidney stones from coming back. Sometimes, medical procedures are needed to remove a kidney stone. This may involve:  A procedure to break up kidney stones using a beam of light (laser) or shock waves.  Surgery to remove the kidney stones. Follow these instructions at home: Medicines  Take over-the-counter and prescription medicines only as told by your doctor.  Ask your doctor if the medicine prescribed to you requires you to avoid driving or using heavy machinery. Eating and drinking  Drink enough fluid to keep your pee pale yellow. You may be told to drink at least 8-10 glasses of water each day. This will help you pass the stone.  If told by your doctor, change your diet. This may include: ? Limiting how much salt you eat. ? Eating more fruits and vegetables. ? Limiting how much meat, poultry, fish, and eggs you eat.  Follow instructions from your doctor about eating or drinking restrictions. General instructions  Collect pee samples as told by your doctor. You may need to collect a pee sample: ? 24 hours after a stone comes out. ? 8-12 weeks after a stone comes out, and every 6-12 months after that.  Strain your pee every time you pee (urinate),  for as long as told. Use the strainer that your doctor recommends.  Do not throw out the stone. Keep it so that it can be tested by your doctor.  Keep all follow-up visits as told by your doctor. This is important. You may need follow-up tests. How is this prevented? To prevent another kidney stone:  Drink enough fluid to keep your pee pale yellow. This is the best way to prevent kidney stones.  Eat healthy foods.  Avoid certain foods as told by your doctor. You may be told to eat less protein.  Stay at a healthy weight. Where to find more  information  [place] ([facility]): www.kidney.[facility]): www.urologyhealth.org Contact a doctor if:  You have pain that gets worse or does not get better with medicine. Get help right away if:  You have a fever or chills.  You get very bad pain.  You get new pain in your belly (abdomen).  You pass out (faint).  You cannot pee. Summary  Kidney stones are rock-like masses that form inside of the kidneys.  Kidney stones can cause very bad pain and can block the flow of pee.  The stones will often pass out of the body through peeing.  Drink enough fluid to keep your pee pale yellow. This information is not intended to replace advice given to you by your health care provider. Make sure you discuss any questions you have with your health care provider. Document Revised: 11/14/2018 Document Reviewed: 11/14/2018 Elsevier Patient Education  [place].

## 2019-12-27 ENCOUNTER — Encounter: Payer: Self-pay | Admitting: Internal Medicine

## 2020-01-06 NOTE — Progress Notes (Signed)
This visit occurred during the SARS-CoV-2 public health emergency.  Safety protocols were in place, including screening questions prior to the visit, additional usage of staff PPE, and extensive cleaning of exam room while observing appropriate contact time as indicated for disinfecting solutions.  Subjective:     Patient ID: Samantha Mcgee , female    DOB: Jan 21, 1966 , 54 y.o.   MRN: [id number]   Chief Complaint  Patient presents with  . Ambien f/u    HPI  She presents today for f/u insomnia. She has been taking zolpidem prn. She usually needs it on Sundays, so she can get good sleep to start the work week. She does not use nightly.     Past Medical History:  Diagnosis Date  . Anxiety   . Arthritis   . Asthma   . Headache   . History of kidney stones   . Interstitial cystitis   . Seasonal allergies      Family History  Problem Relation Age of Onset  . Eczema Brother   . Eczema Daughter   . Diabetes Mother   . Stroke Mother   . Hyperlipidemia Mother   . Arthritis Father   . Heart attack Father   . Hyperlipidemia Father   . Allergic rhinitis Neg Hx   . Angioedema Neg Hx   . Asthma Neg Hx   . Immunodeficiency Neg Hx   . Urticaria Neg Hx      Current Outpatient Medications:  .  albuterol (PROAIR HFA) 108 (90 Base) MCG/ACT inhaler, Use 2 puffs every 4 hours as needed for cough or wheeze.  May use 2 puffs 10-20 minutes prior to exercise., Disp: 1 Inhaler, Rfl: 1 .  amitriptyline (ELAVIL) 10 MG tablet, TAKE 1 TABLET BY MOUTH EVERYDAY AT BEDTIME (Patient taking differently: 3 tablets at bedime), Disp: 90 tablet, Rfl: 0 .  ARNUITY ELLIPTA 200 MCG/ACT AEPB, Inhale 1 puff into the lungs daily., Disp: , Rfl:  .  CALCIUM PO, Take by mouth., Disp: , Rfl:  .  cetirizine (ZYRTEC) 10 MG tablet, TAKE 1 TABLET BY MOUTH EVERY DAY FOR RUNNY NOSE OR ITCHING (Patient taking differently: Take 10 mg by mouth daily. ), Disp: 90 tablet, Rfl: 1 .  Clindamycin-Benzoyl Per, Refr,  gel, Apply 1 application topically daily., Disp: , Rfl: 3 .  ELMIRON 100 MG capsule, TAKE 1 CAPSULE BY MOUTH TWICE A DAY, Disp: 180 capsule, Rfl: 0 .  IBUPROFEN PO, Take 800 mg by mouth., Disp: , Rfl:  .  MAGNESIUM PO, Take by mouth., Disp: , Rfl:  .  montelukast (SINGULAIR) 10 MG tablet, Take 1 tablet (10 mg total) by mouth at bedtime., Disp: 30 tablet, Rfl: 5 .  Omega-3 1000 MG CAPS, Take 1,000 mg by mouth daily., Disp: , Rfl:  .  OVER THE COUNTER MEDICATION, Amberen, Disp: , Rfl:  .  polyethylene glycol (MIRALAX) 17 g packet, Take 17 g by mouth daily. daily, Disp: , Rfl:  .  Probiotic Product (PROBIOTIC PO), Take 1 capsule by mouth 2 (two) times daily. ULTRA FLORA , Disp: , Rfl:  .  RETIN-A MICRO PUMP 0.08 % GEL, Apply 1 application topically at bedtime., Disp: , Rfl: 1 .  vitamin B-12 (CYANOCOBALAMIN) 500 MCG tablet, Take 500 mcg by mouth daily. , Disp: , Rfl:  .  zolpidem (AMBIEN) 5 MG tablet, One tab po qhs prn, Disp: 30 tablet, Rfl: 1   Allergies  Allergen Reactions  . Latex Other (See Comments)    Blisters.  Review of Systems  Constitutional: Negative.   Respiratory: Negative.   Cardiovascular: Negative.   Gastrointestinal: Negative.   Musculoskeletal: Positive for back pain.       She c/o back pain, dull/throbbing. Denies fall/trauma. Denies LE paresthesias/weakness.   Neurological: Negative.   Psychiatric/Behavioral: Positive for sleep disturbance.     Today's Vitals   12/26/19 1526  BP: 118/76  Pulse: 85  Temp: 98.7 F (37.1 C)  TempSrc: Oral  Weight: 196 lb 6.4 oz (89.1 kg)  Height: 5' 4.4" (1.636 m)  PainSc: 0-No pain   Body mass index is 33.29 kg/m.   Objective:  Physical Exam Vitals and nursing note reviewed.  Constitutional:      Appearance: Normal appearance.  HENT:     Head: Normocephalic and atraumatic.  Cardiovascular:     Rate and Rhythm: Normal rate and regular rhythm.     Heart sounds: Normal heart sounds.  Pulmonary:     Effort:  Pulmonary effort is normal.     Breath sounds: Normal breath sounds.  Abdominal:     Tenderness: There is left CVA tenderness.  Skin:    General: Skin is warm.  Neurological:     General: No focal deficit present.     Mental Status: She is alert.  Psychiatric:        Mood and Affect: Mood normal.        Behavior: Behavior normal.         Assessment And Plan:     1. Insomnia, unspecified type  Chronic. She agrees to try Dayvigo, 5mg  nightly.  If ineffective, she will return back to use of zolpidem. She is aware of possible decreased metabolism in women.   2. Left flank pain  This is likely musculoskeletal. However, this could also be musculoskeletal issue. She will let me know if her sx persist.   3. History of kidney stones   Maximino Greenland, MD    THE PATIENT IS ENCOURAGED TO PRACTICE SOCIAL DISTANCING DUE TO THE COVID-19 PANDEMIC.

## 2020-05-09 ENCOUNTER — Other Ambulatory Visit: Payer: Self-pay | Admitting: Internal Medicine

## 2020-05-09 DIAGNOSIS — N6489 Other specified disorders of breast: Secondary | ICD-10-CM

## 2020-05-28 ENCOUNTER — Other Ambulatory Visit: Payer: 59

## 2020-06-16 ENCOUNTER — Other Ambulatory Visit: Payer: Self-pay | Admitting: Internal Medicine

## 2020-06-18 NOTE — Telephone Encounter (Signed)
Zolpidem refill

## 2020-06-25 ENCOUNTER — Ambulatory Visit: Payer: 59

## 2020-06-25 ENCOUNTER — Other Ambulatory Visit: Payer: Self-pay

## 2020-06-25 ENCOUNTER — Ambulatory Visit
Admission: RE | Admit: 2020-06-25 | Discharge: 2020-06-25 | Disposition: A | Discharge status: Home / Self Care | Payer: 59 | Source: Ambulatory Visit | Attending: Internal Medicine | Admitting: Internal Medicine

## 2020-06-25 DIAGNOSIS — N6489 Other specified disorders of breast: Secondary | ICD-10-CM

## 2020-07-03 ENCOUNTER — Encounter: Payer: Self-pay | Admitting: Internal Medicine

## 2020-07-03 ENCOUNTER — Ambulatory Visit (INDEPENDENT_AMBULATORY_CARE_PROVIDER_SITE_OTHER): Payer: 59 | Admitting: Internal Medicine

## 2020-07-03 ENCOUNTER — Other Ambulatory Visit: Payer: Self-pay

## 2020-07-03 VITALS — BP 116/74 | HR 79 | Temp 98.1°F | Ht 63.8 in | Wt 192.0 lb

## 2020-07-03 DIAGNOSIS — R350 Frequency of micturition: Secondary | ICD-10-CM

## 2020-07-03 DIAGNOSIS — R195 Other fecal abnormalities: Secondary | ICD-10-CM

## 2020-07-03 DIAGNOSIS — Z0000 Encounter for general adult medical examination without abnormal findings: Secondary | ICD-10-CM

## 2020-07-03 DIAGNOSIS — M542 Cervicalgia: Secondary | ICD-10-CM

## 2020-07-03 DIAGNOSIS — M722 Plantar fascial fibromatosis: Secondary | ICD-10-CM

## 2020-07-03 DIAGNOSIS — Z800 Family history of malignant neoplasm of digestive organs: Secondary | ICD-10-CM

## 2020-07-03 DIAGNOSIS — E559 Vitamin D deficiency, unspecified: Secondary | ICD-10-CM

## 2020-07-03 LAB — POCT URINALYSIS DIPSTICK
Bilirubin, UA: NEGATIVE
Glucose, UA: NEGATIVE
Ketones, UA: NEGATIVE
Leukocytes, UA: NEGATIVE
Nitrite, UA: NEGATIVE
Protein, UA: NEGATIVE
Spec Grav, UA: 1.01 (ref 1.010–1.025)
Urobilinogen, UA: 0.2 E.U./dL
pH, UA: 6.5 (ref 5.0–8.0)

## 2020-07-03 NOTE — Patient Instructions (Addendum)
Health Maintenance, Female Adopting a healthy lifestyle and getting preventive care are important in promoting health and wellness. Ask your health care provider about:  The right schedule for you to have regular tests and exams.  Things you can do on your own to prevent diseases and keep yourself healthy. What should I know about diet, weight, and exercise? Eat a healthy diet   Eat a diet that includes plenty of vegetables, fruits, low-fat dairy products, and lean protein.  Do not eat a lot of foods that are high in solid fats, added sugars, or sodium. Maintain a healthy weight Body mass index (BMI) is used to identify weight problems. It estimates body fat based on height and weight. Your health care provider can help determine your BMI and help you achieve or maintain a healthy weight. Get regular exercise Get regular exercise. This is one of the most important things you can do for your health. Most adults should:  Exercise for at least 150 minutes each week. The exercise should increase your heart rate and make you sweat (moderate-intensity exercise).  Do strengthening exercises at least twice a week. This is in addition to the moderate-intensity exercise.  Spend less time sitting. Even light physical activity can be beneficial. Watch cholesterol and blood lipids Have your blood tested for lipids and cholesterol at 53 years of age, then have this test every 5 years. Have your cholesterol levels checked more often if:  Your lipid or cholesterol levels are high.  You are older than 53 years of age.  You are at high risk for heart disease. What should I know about cancer screening? Depending on your health history and family history, you may need to have cancer screening at various ages. This may include screening for:  Breast cancer.  Cervical cancer.  Colorectal cancer.  Skin cancer.  Lung cancer. What should I know about heart disease, diabetes, and high blood  pressure? Blood pressure and heart disease  High blood pressure causes heart disease and increases the risk of stroke. This is more likely to develop in people who have high blood pressure readings, are of African descent, or are overweight.  Have your blood pressure checked: ? Every 3-5 years if you are 18-39 years of age. ? Every year if you are 40 years old or older. Diabetes Have regular diabetes screenings. This checks your fasting blood sugar level. Have the screening done:  Once every three years after age 40 if you are at a normal weight and have a low risk for diabetes.  More often and at a younger age if you are overweight or have a high risk for diabetes. What should I know about preventing infection? Hepatitis B If you have a higher risk for hepatitis B, you should be screened for this virus. Talk with your health care provider to find out if you are at risk for hepatitis B infection. Hepatitis C Testing is recommended for:  Everyone born from 1945 through 1965.  Anyone with known risk factors for hepatitis C. Sexually transmitted infections (STIs)  Get screened for STIs, including gonorrhea and chlamydia, if: ? You are sexually active and are younger than 53 years of age. ? You are older than 53 years of age and your health care provider tells you that you are at risk for this type of infection. ? Your sexual activity has changed since you were last screened, and you are at increased risk for chlamydia or gonorrhea. Ask your health care provider if   you are at risk.  Ask your health care provider about whether you are at high risk for HIV. Your health care provider may recommend a prescription medicine to help prevent HIV infection. If you choose to take medicine to prevent HIV, you should first get tested for HIV. You should then be tested every 3 months for as long as you are taking the medicine. Pregnancy  If you are about to stop having your period (premenopausal) and  you may become pregnant, seek counseling before you get pregnant.  Take 400 to 800 micrograms (mcg) of folic acid every day if you become pregnant.  Ask for birth control (contraception) if you want to prevent pregnancy. Osteoporosis and menopause Osteoporosis is a disease in which the bones lose minerals and strength with aging. This can result in bone fractures. If you are 65 years old or older, or if you are at risk for osteoporosis and fractures, ask your health care provider if you should:  Be screened for bone loss.  Take a calcium or vitamin D supplement to lower your risk of fractures.  Be given hormone replacement therapy (HRT) to treat symptoms of menopause. Follow these instructions at home: Lifestyle  Do not use any products that contain nicotine or tobacco, such as cigarettes, e-cigarettes, and chewing tobacco. If you need help quitting, ask your health care provider.  Do not use street drugs.  Do not share needles.  Ask your health care provider for help if you need support or information about quitting drugs. Alcohol use  Do not drink alcohol if: ? Your health care provider tells you not to drink. ? You are pregnant, may be pregnant, or are planning to become pregnant.  If you drink alcohol: ? Limit how much you use to 0-1 drink a day. ? Limit intake if you are breastfeeding.  Be aware of how much alcohol is in your drink. In the [place] one drink equals one 12 oz bottle of beer (355 mL), one 5 oz glass of wine (148 mL), or one 1 oz glass of hard liquor (44 mL). General instructions  Schedule regular health, dental, and eye exams.  Stay current with your vaccines.  Tell your health care provider if: ? You often feel depressed. ? You have ever been abused or do not feel safe at home. Summary  Adopting a healthy lifestyle and getting preventive care are important in promoting health and wellness.  Follow your health care provider's instructions about healthy  diet, exercising, and getting tested or screened for diseases.  Follow your health care provider's instructions on monitoring your cholesterol and blood pressure. This information is not intended to replace advice given to you by your health care provider. Make sure you discuss any questions you have with your health care provider. Document Revised: 06/21/2018 Document Reviewed: 06/21/2018 Elsevier Patient Education  2020 [employer]  

## 2020-07-03 NOTE — Progress Notes (Signed)
Rutherford Nail as a scribe for Maximino Greenland, MD.,have documented all relevant documentation on the behalf of Maximino Greenland, MD,as directed by  Maximino Greenland, MD while in the presence of Maximino Greenland, MD. This visit occurred during the SARS-CoV-2 public health emergency.  Safety protocols were in place, including screening questions prior to the visit, additional usage of staff PPE, and extensive cleaning of exam room while observing appropriate contact time as indicated for disinfecting solutions.  Subjective:     Patient ID: Samantha Mcgee , female    DOB: 25-Nov-1965 , 54 y.o.   MRN: [id number]   Chief Complaint  Patient presents with  . Annual Exam    HPI  She is here today for a full physical examination.  She is followed by Dr. Garwin Brothers for her GYN exams. She is unsure of her last visit.    Past Medical History:  Diagnosis Date  . Anxiety   . Arthritis   . Asthma   . Headache   . History of kidney stones   . Interstitial cystitis   . Seasonal allergies      Family History  Problem Relation Age of Onset  . Eczema Brother   . Eczema Daughter   . Diabetes Mother   . Stroke Mother   . Hyperlipidemia Mother   . Arthritis Father   . Heart attack Father   . Hyperlipidemia Father   . Allergic rhinitis Neg Hx   . Angioedema Neg Hx   . Asthma Neg Hx   . Immunodeficiency Neg Hx   . Urticaria Neg Hx      Current Outpatient Medications:  .  albuterol (PROAIR HFA) 108 (90 Base) MCG/ACT inhaler, Use 2 puffs every 4 hours as needed for cough or wheeze.  May use 2 puffs 10-20 minutes prior to exercise., Disp: 1 Inhaler, Rfl: 1 .  amitriptyline (ELAVIL) 10 MG tablet, TAKE 1 TABLET BY MOUTH EVERYDAY AT BEDTIME (Patient taking differently: 3 tablets at bedime), Disp: 90 tablet, Rfl: 0 .  ARNUITY ELLIPTA 200 MCG/ACT AEPB, Inhale 1 puff into the lungs daily., Disp: , Rfl:  .  CALCIUM PO, Take by mouth., Disp: , Rfl:  .  cetirizine (ZYRTEC) 10 MG  tablet, TAKE 1 TABLET BY MOUTH EVERY DAY FOR RUNNY NOSE OR ITCHING (Patient taking differently: Take 10 mg by mouth daily.), Disp: 90 tablet, Rfl: 1 .  Clindamycin-Benzoyl Per, Refr, gel, Apply 1 application topically daily., Disp: , Rfl: 3 .  IBUPROFEN PO, Take 800 mg by mouth., Disp: , Rfl:  .  montelukast (SINGULAIR) 10 MG tablet, Take 1 tablet (10 mg total) by mouth at bedtime., Disp: 30 tablet, Rfl: 5 .  Omega-3 1000 MG CAPS, Take 1,000 mg by mouth daily., Disp: , Rfl:  .  OVER THE COUNTER MEDICATION, Amberen, Disp: , Rfl:  .  OVER THE COUNTER MEDICATION, Vit D 1000 daily, Disp: , Rfl:  .  polyethylene glycol (MIRALAX / GLYCOLAX) 17 g packet, Take 17 g by mouth daily. daily, Disp: , Rfl:  .  Probiotic Product (PROBIOTIC PO), Take 1 capsule by mouth 2 (two) times daily. ULTRA FLORA, Disp: , Rfl:  .  RETIN-A MICRO PUMP 0.08 % GEL, Apply 1 application topically at bedtime., Disp: , Rfl: 1 .  vitamin B-12 (CYANOCOBALAMIN) 500 MCG tablet, Take 500 mcg by mouth daily. , Disp: , Rfl:  .  zolpidem (AMBIEN) 5 MG tablet, TAKE 1 TABLET BY MOUTH AT BEDTIME AS NEEDED, Disp: 30  tablet, Rfl: 0 .  MAGNESIUM PO, Take by mouth. (Patient not taking: Reported on 07/03/2020), Disp: , Rfl:    Allergies  Allergen Reactions  . Latex Other (See Comments)    Blisters.      The patient states she uses none for birth control. Last LMP was Patient's last menstrual period was 03/06/2018.. Negative for Dysmenorrhea. Negative for: breast discharge, breast lump(s), breast pain and breast self exam. Associated symptoms include abnormal vaginal bleeding. Pertinent negatives include abnormal bleeding (hematology), anxiety, decreased libido, depression, difficulty falling sleep, dyspareunia, history of infertility, nocturia, sexual dysfunction, sleep disturbances, urinary incontinence, urinary urgency, vaginal discharge and vaginal itching. Diet regular.The patient states her exercise level is  intermittent.   . The patient's  tobacco use is:  Social History   Tobacco Use  Smoking Status Never Smoker  Smokeless Tobacco Never Used  . She has been exposed to passive smoke. The patient's alcohol use is:  Social History   Substance and Sexual Activity  Alcohol Use Yes  . Alcohol/week: 1.0 standard drink  . Types: 1 Glasses of wine per week   Comment: weekly    Review of Systems  Constitutional: Negative.  Negative for fatigue.  HENT: Negative.   Eyes: Negative.   Respiratory: Negative.   Cardiovascular: Negative.   Gastrointestinal: Negative.   Endocrine: Negative.  Negative for polydipsia, polyphagia and polyuria.  Genitourinary: Negative.   Musculoskeletal: Positive for arthralgias.       She c/o b/l foot pain. Has h/o plantar fasciitis. Has been wearing steeltoes at work. Has tried Dr. Matthias Hughs inserts w/o improvement of sx.   Skin: Negative.   Allergic/Immunologic: Negative.   Neurological: Negative.  Negative for dizziness and headaches.  Hematological: Negative.   Psychiatric/Behavioral: Negative.      Today's Vitals   07/03/20 0921  BP: 116/74  Pulse: 79  Temp: 98.1 F (36.7 C)  TempSrc: Oral  Weight: 192 lb (87.1 kg)  Height: 5' 3.8" (1.621 m)   Body mass index is 33.16 kg/m.   Wt Readings from Last 3 Encounters:  07/03/20 192 lb (87.1 kg)  12/26/19 196 lb 6.4 oz (89.1 kg)  06/21/19 192 lb 9.6 oz (87.4 kg)   Objective:  Physical Exam Vitals and nursing note reviewed.  Constitutional:      Appearance: Normal appearance.  HENT:     Head: Normocephalic and atraumatic.     Right Ear: Tympanic membrane, ear canal and external ear normal.     Left Ear: Tympanic membrane, ear canal and external ear normal.     Nose:     Comments: Deferred, masked    Mouth/Throat:     Comments: Deferred, masked Eyes:     Extraocular Movements: Extraocular movements intact.     Conjunctiva/sclera: Conjunctivae normal.     Pupils: Pupils are equal, round, and reactive to light.  Cardiovascular:      Rate and Rhythm: Normal rate and regular rhythm.     Pulses: Normal pulses.     Heart sounds: Normal heart sounds.  Pulmonary:     Effort: Pulmonary effort is normal.     Breath sounds: Normal breath sounds.  Chest:  Breasts:     Tanner Score is 5.     Right: Normal.     Left: Normal.    Abdominal:     General: Bowel sounds are normal.     Palpations: Abdomen is soft.  Genitourinary:    Comments: deferred Musculoskeletal:        General:  Normal range of motion.     Cervical back: Normal range of motion and neck supple.  Skin:    General: Skin is warm and dry.  Neurological:     General: No focal deficit present.     Mental Status: She is alert and oriented to person, place, and time.  Psychiatric:        Mood and Affect: Mood normal.        Behavior: Behavior normal.        Assessment And Plan:     1. Routine general medical examination at health care facility Comments: A full exam was performed. Importance of monthly self breast exams was discussed with the patient. PATIENT IS ADVISED TO GET 30-45 MINUTES REGULAR EXERCISE NO LESS THAN FOUR TO FIVE DAYS PER WEEK - BOTH WEIGHTBEARING EXERCISES AND AEROBIC ARE RECOMMENDED.  PATIENT IS ADVISED TO FOLLOW A HEALTHY DIET WITH AT LEAST SIX FRUITS/VEGGIES PER DAY, DECREASE INTAKE OF RED MEAT, AND TO INCREASE FISH INTAKE TO TWO DAYS PER WEEK.  MEATS/FISH SHOULD NOT BE FRIED, BAKED OR BROILED IS PREFERABLE.  I SUGGEST WEARING SPF 50 SUNSCREEN ON EXPOSED PARTS AND ESPECIALLY WHEN IN THE DIRECT SUNLIGHT FOR AN EXTENDED PERIOD OF TIME.  PLEASE AVOID FAST FOOD RESTAURANTS AND INCREASE YOUR WATER INTAKE. - CMP14+EGFR - Hepatitis C antibody - CBC - Hemoglobin A1c - Lipid panel - Ambulatory referral to Gynecology  2. Plantar fasciitis, bilateral Comments: She is advised to perform stretching exercises daily. Also advised to wear inserts in workboots.   3. Change in stool caliber Comments: She reports having regular BMs on Miralax.   However, she has noticed change in caliber of stools. She agrees to GI referral.  - Ambulatory referral to Gastroenterology  4. Cervicalgia Comments: Encouraged to pursue therapeutic massage. May also benefit from chiro evaluation. Encouraged to perform stretching exercises.   5. Urinary frequency Comments: I will check urinalysis. Recently advised she does not have interstitial cystitis.  - POCT Urinalysis Dipstick (81002)  6. Vitamin D deficiency disease Comments: I will check vitamin D level and supplement as needed . - Vitamin D (25 hydroxy)  7. Family history of colon cancer Comments: Brother diagnosed at age 74.  - Ambulatory referral to Gastroenterology  Patient was given opportunity to ask questions. Patient verbalized understanding of the plan and was able to repeat key elements of the plan. All questions were answered to their satisfaction.  Maximino Greenland, MD   I, Maximino Greenland, MD, have reviewed all documentation for this visit. The documentation on 07/07/20 for the exam, diagnosis, procedures, and orders are all accurate and complete.  THE PATIENT IS ENCOURAGED TO PRACTICE SOCIAL DISTANCING DUE TO THE COVID-19 PANDEMIC.

## 2020-07-04 LAB — LIPID PANEL
Chol/HDL Ratio: 3 ratio (ref 0.0–4.4)
Cholesterol, Total: 163 mg/dL (ref 100–199)
HDL: 54 mg/dL (ref >39)
LDL Chol Calc (NIH): 97 mg/dL (ref 0–99)
Triglycerides: 62 mg/dL (ref 0–149)
VLDL Cholesterol Cal: 12 mg/dL (ref 5–40)

## 2020-07-04 LAB — CMP14+EGFR
ALT: 12 IU/L (ref 0–32)
AST: 14 IU/L (ref 0–40)
Albumin/Globulin Ratio: 1.5 (ref 1.2–2.2)
Albumin: 4.3 g/dL (ref 3.8–4.9)
Alkaline Phosphatase: 94 IU/L (ref 44–121)
BUN/Creatinine Ratio: 11 (ref 9–23)
BUN: 8 mg/dL (ref 6–24)
Bilirubin Total: 0.3 mg/dL (ref 0.0–1.2)
CO2: 22 mmol/L (ref 20–29)
Calcium: 9.8 mg/dL (ref 8.7–10.2)
Chloride: 101 mmol/L (ref 96–106)
Creatinine, Ser: 0.72 mg/dL (ref 0.57–1.00)
GFR calc Af Amer: 111 mL/min/{1.73_m2} (ref >59)
GFR calc non Af Amer: 96 mL/min/{1.73_m2} (ref >59)
Globulin, Total: 2.9 g/dL (ref 1.5–4.5)
Glucose: 82 mg/dL (ref 65–99)
Potassium: 4.6 mmol/L (ref 3.5–5.2)
Sodium: 139 mmol/L (ref 134–144)
Total Protein: 7.2 g/dL (ref 6.0–8.5)

## 2020-07-04 LAB — VITAMIN D 25 HYDROXY (VIT D DEFICIENCY, FRACTURES): Vit D, 25-Hydroxy: 45.1 ng/mL (ref 30.0–100.0)

## 2020-07-04 LAB — CBC
Hematocrit: 44.9 % (ref 34.0–46.6)
Hemoglobin: 14.4 g/dL (ref 11.1–15.9)
MCH: 27.3 pg (ref 26.6–33.0)
MCHC: 32.1 g/dL (ref 31.5–35.7)
MCV: 85 fL (ref 79–97)
Platelets: 347 10*3/uL (ref 150–450)
RBC: 5.27 x10E6/uL (ref 3.77–5.28)
RDW: 12.1 % (ref 11.7–15.4)
WBC: 9.2 10*3/uL (ref 3.4–10.8)

## 2020-07-04 LAB — HEPATITIS C ANTIBODY: Hep C Virus Ab: <0.1 s/co ratio (ref 0.0–0.9)

## 2020-07-04 LAB — HEMOGLOBIN A1C
Est. average glucose Bld gHb Est-mCnc: 126 mg/dL
Hgb A1c MFr Bld: 6 % — ABNORMAL HIGH (ref 4.8–5.6)

## 2020-07-31 ENCOUNTER — Encounter: Payer: Self-pay | Admitting: Internal Medicine

## 2020-08-14 ENCOUNTER — Telehealth: Payer: Self-pay

## 2020-08-14 NOTE — Telephone Encounter (Signed)
The pt was given the information to [facility] because she is having foot pain and wanted to see about getting an injection in her foot.

## 2020-08-29 DIAGNOSIS — M79671 Pain in right foot: Secondary | ICD-10-CM | POA: Diagnosis not present

## 2020-08-29 DIAGNOSIS — M722 Plantar fascial fibromatosis: Secondary | ICD-10-CM | POA: Diagnosis not present

## 2020-09-11 ENCOUNTER — Other Ambulatory Visit: Payer: Self-pay | Admitting: Internal Medicine

## 2020-09-12 NOTE — Telephone Encounter (Signed)
Zolpidem refill

## 2020-10-02 ENCOUNTER — Other Ambulatory Visit: Payer: Self-pay | Admitting: Internal Medicine

## 2020-11-07 DIAGNOSIS — D124 Benign neoplasm of descending colon: Secondary | ICD-10-CM | POA: Diagnosis not present

## 2020-11-07 DIAGNOSIS — D122 Benign neoplasm of ascending colon: Secondary | ICD-10-CM | POA: Diagnosis not present

## 2020-11-07 DIAGNOSIS — Z1211 Encounter for screening for malignant neoplasm of colon: Secondary | ICD-10-CM | POA: Diagnosis not present

## 2020-11-07 DIAGNOSIS — Z800 Family history of malignant neoplasm of digestive organs: Secondary | ICD-10-CM | POA: Diagnosis not present

## 2020-11-07 DIAGNOSIS — K635 Polyp of colon: Secondary | ICD-10-CM | POA: Diagnosis not present

## 2020-11-07 LAB — HM COLONOSCOPY

## 2020-11-10 ENCOUNTER — Encounter: Payer: Self-pay | Admitting: Internal Medicine

## 2021-02-04 DIAGNOSIS — Z20822 Contact with and (suspected) exposure to covid-19: Secondary | ICD-10-CM | POA: Diagnosis not present

## 2021-03-06 DIAGNOSIS — K219 Gastro-esophageal reflux disease without esophagitis: Secondary | ICD-10-CM | POA: Diagnosis not present

## 2021-03-06 DIAGNOSIS — J309 Allergic rhinitis, unspecified: Secondary | ICD-10-CM | POA: Diagnosis not present

## 2021-04-12 ENCOUNTER — Other Ambulatory Visit: Payer: Self-pay | Admitting: Internal Medicine

## 2021-06-10 DIAGNOSIS — J3089 Other allergic rhinitis: Secondary | ICD-10-CM | POA: Diagnosis not present

## 2021-06-10 DIAGNOSIS — J4530 Mild persistent asthma, uncomplicated: Secondary | ICD-10-CM | POA: Diagnosis not present

## 2021-06-10 DIAGNOSIS — J301 Allergic rhinitis due to pollen: Secondary | ICD-10-CM | POA: Diagnosis not present

## 2021-06-12 DIAGNOSIS — J45909 Unspecified asthma, uncomplicated: Secondary | ICD-10-CM | POA: Diagnosis not present

## 2021-06-12 DIAGNOSIS — R5383 Other fatigue: Secondary | ICD-10-CM | POA: Diagnosis not present

## 2021-06-12 DIAGNOSIS — R059 Cough, unspecified: Secondary | ICD-10-CM | POA: Diagnosis not present

## 2021-06-30 DIAGNOSIS — N898 Other specified noninflammatory disorders of vagina: Secondary | ICD-10-CM | POA: Diagnosis not present

## 2021-07-16 DIAGNOSIS — L680 Hirsutism: Secondary | ICD-10-CM | POA: Diagnosis not present

## 2021-07-16 DIAGNOSIS — L700 Acne vulgaris: Secondary | ICD-10-CM | POA: Diagnosis not present

## 2021-07-16 DIAGNOSIS — L819 Disorder of pigmentation, unspecified: Secondary | ICD-10-CM | POA: Diagnosis not present

## 2021-07-16 DIAGNOSIS — L820 Inflamed seborrheic keratosis: Secondary | ICD-10-CM | POA: Diagnosis not present

## 2021-07-23 ENCOUNTER — Encounter: Payer: Self-pay | Admitting: Internal Medicine

## 2021-07-23 ENCOUNTER — Other Ambulatory Visit: Payer: Self-pay

## 2021-07-23 ENCOUNTER — Ambulatory Visit (INDEPENDENT_AMBULATORY_CARE_PROVIDER_SITE_OTHER): Payer: BC Managed Care – PPO | Admitting: Internal Medicine

## 2021-07-23 VITALS — BP 116/70 | HR 93 | Temp 98.1°F | Ht 63.2 in | Wt 194.4 lb

## 2021-07-23 DIAGNOSIS — U071 COVID-19: Secondary | ICD-10-CM

## 2021-07-23 DIAGNOSIS — Z6834 Body mass index (BMI) 34.0-34.9, adult: Secondary | ICD-10-CM | POA: Diagnosis not present

## 2021-07-23 DIAGNOSIS — Z0000 Encounter for general adult medical examination without abnormal findings: Secondary | ICD-10-CM

## 2021-07-23 DIAGNOSIS — R7309 Other abnormal glucose: Secondary | ICD-10-CM | POA: Diagnosis not present

## 2021-07-23 LAB — LIPID PANEL
Chol/HDL Ratio: 3.7 ratio (ref 0.0–4.4)
Cholesterol, Total: 172 mg/dL (ref 100–199)
HDL: 47 mg/dL (ref >39)
LDL Chol Calc (NIH): 113 mg/dL — ABNORMAL HIGH (ref 0–99)
Triglycerides: 61 mg/dL (ref 0–149)
VLDL Cholesterol Cal: 12 mg/dL (ref 5–40)

## 2021-07-23 LAB — CMP14+EGFR
ALT: 13 IU/L (ref 0–32)
AST: 15 IU/L (ref 0–40)
Albumin/Globulin Ratio: 1.4 (ref 1.2–2.2)
Albumin: 4.1 g/dL (ref 3.8–4.9)
Alkaline Phosphatase: 97 IU/L (ref 44–121)
BUN/Creatinine Ratio: 13 (ref 9–23)
BUN: 10 mg/dL (ref 6–24)
Bilirubin Total: 0.2 mg/dL (ref 0.0–1.2)
CO2: 22 mmol/L (ref 20–29)
Calcium: 9.2 mg/dL (ref 8.7–10.2)
Chloride: 104 mmol/L (ref 96–106)
Creatinine, Ser: 0.79 mg/dL (ref 0.57–1.00)
Globulin, Total: 3 g/dL (ref 1.5–4.5)
Glucose: 96 mg/dL (ref 70–99)
Potassium: 4.1 mmol/L (ref 3.5–5.2)
Sodium: 140 mmol/L (ref 134–144)
Total Protein: 7.1 g/dL (ref 6.0–8.5)
eGFR: 89 mL/min/{1.73_m2} (ref >59)

## 2021-07-23 LAB — HEMOGLOBIN A1C
Est. average glucose Bld gHb Est-mCnc: 128 mg/dL
Hgb A1c MFr Bld: 6.1 % — ABNORMAL HIGH (ref 4.8–5.6)

## 2021-07-23 LAB — CBC
Hematocrit: 39 % (ref 34.0–46.6)
Hemoglobin: 13.2 g/dL (ref 11.1–15.9)
MCH: 28.1 pg (ref 26.6–33.0)
MCHC: 33.8 g/dL (ref 31.5–35.7)
MCV: 83 fL (ref 79–97)
Platelets: 313 10*3/uL (ref 150–450)
RBC: 4.69 x10E6/uL (ref 3.77–5.28)
RDW: 12.3 % (ref 11.7–15.4)
WBC: 7.1 10*3/uL (ref 3.4–10.8)

## 2021-07-23 LAB — POC COVID19 BINAXNOW: SARS Coronavirus 2 Ag: POSITIVE — AB

## 2021-07-23 MED ORDER — LAGEVRIO 200 MG PO CAPS: 4.0000 | ORAL_CAPSULE | Freq: Two times a day (BID) | ORAL | 0 refills | Status: AC

## 2021-07-23 MED ORDER — HYDROCODONE BIT-HOMATROP MBR 5-1.5 MG/5ML PO SOLN: 5.0000 mL | Freq: Four times a day (QID) | ORAL | 0 refills | Status: DC | PRN

## 2021-07-23 NOTE — Progress Notes (Signed)
I,[name],acting as a Education administrator for Maximino Greenland, MD.,have documented all relevant documentation on the behalf of Maximino Greenland, MD,as directed by  Maximino Greenland, MD while in the presence of Maximino Greenland, MD.  This visit occurred during the SARS-CoV-2 public health emergency.  Safety protocols were in place, including screening questions prior to the visit, additional usage of staff PPE, and extensive cleaning of exam room while observing appropriate contact time as indicated for disinfecting solutions.  Subjective:     Patient ID: Samantha Mcgee , female    DOB: November 06, 1965 , 56 y.o.   MRN: [id number]   Chief Complaint  Patient presents with   Annual Exam    HPI  She is here today for a full physical examination.  She is followed by Dr. Garwin Brothers for her GYN exams, her last pap  was 2022.  The patient states that she wanted to know if she can get a z-pack for her cold symptoms.  States she awakened with a sore throat two days ago. She states her husband has been sick, and her daughter as well. She has not tested herself for COVID. She does admit to a dry cough at night.     Past Medical History:  Diagnosis Date   Anxiety    Arthritis    Asthma    Headache    History of kidney stones    Interstitial cystitis    Seasonal allergies      Family History  Problem Relation Age of Onset   Eczema Brother    Eczema Daughter    Diabetes Mother    Stroke Mother    Hyperlipidemia Mother    Arthritis Father    Heart attack Father    Hyperlipidemia Father    Allergic rhinitis Neg Hx    Angioedema Neg Hx    Asthma Neg Hx    Immunodeficiency Neg Hx    Urticaria Neg Hx      Current Outpatient Medications:    HYDROcodone bit-homatropine (HYDROMET) 5-1.5 MG/5ML syrup, Take 5 mLs by mouth every 6 (six) hours as needed for cough., Disp: 120 mL, Rfl: 0   molnupiravir EUA (LAGEVRIO) 200 MG CAPS capsule, Take 4 capsules (800 mg total) by mouth  2 (two) times daily for 5 days., Disp: 40 capsule, Rfl: 0   albuterol (PROAIR HFA) 108 (90 Base) MCG/ACT inhaler, Use 2 puffs every 4 hours as needed for cough or wheeze.  May use 2 puffs 10-20 minutes prior to exercise., Disp: 1 Inhaler, Rfl: 1   amitriptyline (ELAVIL) 10 MG tablet, TAKE 3 TABLETS BY MOUTH AT BEDTIME AS NEEDED FOR SLEEP, Disp: 270 tablet, Rfl: 2   ARNUITY ELLIPTA 200 MCG/ACT AEPB, Inhale 1 puff into the lungs daily., Disp: , Rfl:    CALCIUM PO, Take by mouth., Disp: , Rfl:    cetirizine (ZYRTEC) 10 MG tablet, TAKE 1 TABLET BY MOUTH EVERY DAY FOR RUNNY NOSE OR ITCHING (Patient taking differently: Take 10 mg by mouth daily.), Disp: 90 tablet, Rfl: 1   Clindamycin-Benzoyl Per, Refr, gel, Apply 1 application topically daily., Disp: , Rfl: 3   IBUPROFEN PO, Take 800 mg by mouth., Disp: , Rfl:    MAGNESIUM PO, Take by mouth. (Patient not taking: Reported on 07/03/2020), Disp: , Rfl:    montelukast (SINGULAIR) 10 MG tablet, Take 1 tablet (10 mg total) by mouth at bedtime., Disp: 30 tablet, Rfl: 5   Omega-3 1000 MG CAPS, Take 1,000 mg by mouth daily.,  Disp: , Rfl:    OVER THE COUNTER MEDICATION, Amberen, Disp: , Rfl:    OVER THE COUNTER MEDICATION, Vit D 1000 daily, Disp: , Rfl:    polyethylene glycol (MIRALAX / GLYCOLAX) 17 g packet, Take 17 g by mouth daily. daily, Disp: , Rfl:    Probiotic Product (PROBIOTIC PO), Take 1 capsule by mouth 2 (two) times daily. ULTRA FLORA, Disp: , Rfl:    RETIN-A MICRO PUMP 0.08 % GEL, Apply 1 application topically at bedtime., Disp: , Rfl: 1   vitamin B-12 (CYANOCOBALAMIN) 500 MCG tablet, Take 500 mcg by mouth daily. , Disp: , Rfl:    zolpidem (AMBIEN) 5 MG tablet, TAKE 1 TABLET BY MOUTH EVERY DAY AT BEDTIME AS NEEDED, Disp: 30 tablet, Rfl: 2   Allergies  Allergen Reactions   Latex Other (See Comments)    Blisters.      The patient states she uses status post hysterectomy for birth control. Last LMP was Patient's last menstrual  period was 03/06/2018.. Negative for Dysmenorrhea. Negative for: breast discharge, breast lump(s), breast pain and breast self exam. Associated symptoms include abnormal vaginal bleeding. Pertinent negatives include abnormal bleeding (hematology), anxiety, decreased libido, depression, difficulty falling sleep, dyspareunia, history of infertility, nocturia, sexual dysfunction, sleep disturbances, urinary incontinence, urinary urgency, vaginal discharge and vaginal itching. Diet regular.The patient states her exercise level is  intermittent.  . The patient's tobacco use is:  Social History   Tobacco Use  Smoking Status Never  Smokeless Tobacco Never  . She has been exposed to passive smoke. The patient's alcohol use is:  Social History   Substance and Sexual Activity  Alcohol Use Yes   Alcohol/week: 1.0 standard drink   Types: 1 Glasses of wine per week   Comment: weekly   Review of Systems  Constitutional: Negative.   HENT:  Positive for sore throat.   Eyes: Negative.   Respiratory:  Positive for cough.   Cardiovascular: Negative.   Gastrointestinal: Negative.   Endocrine: Negative.   Genitourinary: Negative.   Musculoskeletal: Negative.   Skin: Negative.   Allergic/Immunologic: Negative.   Neurological: Negative.   Hematological: Negative.   Psychiatric/Behavioral: Negative.      Today's Vitals   07/23/21 0830  BP: 116/70  Pulse: 93  Temp: 98.1 F (36.7 C)  Weight: 194 lb 6.4 oz (88.2 kg)  Height: 5' 3.2" (1.605 m)   Body mass index is 34.22 kg/m.  Wt Readings from Last 3 Encounters:  07/23/21 194 lb 6.4 oz (88.2 kg)  07/03/20 192 lb (87.1 kg)  12/26/19 196 lb 6.4 oz (89.1 kg)    BP Readings from Last 3 Encounters:  07/23/21 116/70  07/03/20 116/74  12/26/19 118/76    Objective:  Physical Exam Vitals and nursing note reviewed.  Constitutional:      Appearance: Normal appearance.  HENT:     Head: Normocephalic and atraumatic.     Right Ear: Tympanic  membrane, ear canal and external ear normal.     Left Ear: Tympanic membrane, ear canal and external ear normal.     Nose:     Comments: Masked     Mouth/Throat:     Comments: Masked Eyes:     Extraocular Movements: Extraocular movements intact.     Conjunctiva/sclera: Conjunctivae normal.     Pupils: Pupils are equal, round, and reactive to light.  Cardiovascular:     Rate and Rhythm: Normal rate and regular rhythm.     Pulses: Normal pulses.  Heart sounds: Normal heart sounds.  Pulmonary:     Effort: Pulmonary effort is normal.     Breath sounds: Normal breath sounds.  Chest:  Breasts:    Tanner Score is 5.     Right: Normal.     Left: Normal.  Abdominal:     General: Abdomen is flat. Bowel sounds are normal.     Palpations: Abdomen is soft.  Genitourinary:    Comments: deferred Musculoskeletal:        General: Normal range of motion.     Cervical back: Normal range of motion and neck supple.  Skin:    General: Skin is warm and dry.  Neurological:     General: No focal deficit present.     Mental Status: She is alert and oriented to person, place, and time.  Psychiatric:        Mood and Affect: Mood normal.        Behavior: Behavior normal.        Assessment And Plan:     1. Routine general medical examination at health care facility Comments: A full exam was performed. Importance of monthly self breast exams was d/w patient. PATIENT IS ADVISED TO GET 30-45 MINUTES REGULAR EXERCISE NO LESS THAN FOUR TO FIVE DAYS PER WEEK - BOTH WEIGHTBEARING EXERCISES AND AEROBIC ARE RECOMMENDED.  PATIENT IS ADVISED TO FOLLOW A HEALTHY DIET WITH AT LEAST SIX FRUITS/VEGGIES PER DAY, DECREASE INTAKE OF RED MEAT, AND TO INCREASE FISH INTAKE TO TWO DAYS PER WEEK.  MEATS/FISH SHOULD NOT BE FRIED, BAKED OR BROILED IS PREFERABLE.  IT IS ALSO IMPORTANT TO CUT BACK ON YOUR SUGAR INTAKE. PLEASE AVOID ANYTHING WITH ADDED SUGAR, CORN SYRUP OR OTHER SWEETENERS. IF YOU MUST USE A SWEETENER, YOU CAN  TRY STEVIA. IT IS ALSO IMPORTANT TO AVOID ARTIFICIALLY SWEETENERS AND DIET BEVERAGES. LASTLY, I SUGGEST WEARING SPF 50 SUNSCREEN ON EXPOSED PARTS AND ESPECIALLY WHEN IN THE DIRECT SUNLIGHT FOR AN EXTENDED PERIOD OF TIME.  PLEASE AVOID FAST FOOD RESTAURANTS AND INCREASE YOUR WATER INTAKE.  - Hemoglobin A1c - CBC - CMP14+EGFR - Lipid panel  2. COVID-19 Comments: POC pos. She requests treatment, rx Lagevrio sent to pharmacy. Last GFR was in 2021. Possible side effects d/w patient. Advised to take with food.  Encouraged to go to ER if she develops SOB or worsening sx. Advised patient to take Vitamin C, D, Zinc.  Keep yourself hydrated with a lot of water and rest. Take Delsym for cough and Mucinex as needed.  She was also sent rx Hydromet to take prn. She has taken this in the past without any issues. Take Tylenol or pain reliever every 4-6 hours as needed for pain/fever/body ache. If you have elevated blood pressure, you can take OTC Coricidin. You can also take OTC Oscillococcinum, a homeopathic remedy,  to help with your symptoms.  Educated patient that if symptoms get worse or if he/she experiences any SOB, chest pain or pain in their legs to seek immediate emergency care. Continue to monitor your oxygen levels. Call office ASAP if you have any questions. Quarantine for 5 days if tested positive and no symptoms or 10 days if tested positive and you are with symptoms. Wear a mask around other people.   - POC COVID-19 - Temperature monitoring; Future  3. BMI 34.0-34.9,adult She is encouraged to strive for BMI less than 30 to decrease cardiac risk. Advised to aim for at least 150 minutes of exercise per week.   Patient was given  opportunity to ask questions. Patient verbalized understanding of the plan and was able to repeat key elements of the plan. All questions were answered to their satisfaction.   I, Maximino Greenland, MD, have reviewed all documentation for this visit. The documentation on  07/23/21 for the exam, diagnosis, procedures, and orders are all accurate and complete.   THE PATIENT IS ENCOURAGED TO PRACTICE SOCIAL DISTANCING DUE TO THE COVID-19 PANDEMIC.

## 2021-07-23 NOTE — Patient Instructions (Addendum)
Oscillococcinum- homeopathic remedy  Advised patient to take Vitamin C, D, Zinc.  Keep yourself hydrated with a lot of water and rest. Take Delsym for cough and Mucinex as needed. Take Tylenol or pain reliever every 4-6 hours as needed for pain/fever/body ache. If you have elevated blood pressure, you can take OTC Coricidin. You can also take OTC Oscillococcinum, a homeopathic remedy,  to help with your symptoms.  Educated patient that if symptoms get worse or if he/she experiences any SOB, chest pain or pain in their legs to seek immediate emergency care. Continue to monitor your oxygen levels. Call office ASAP if you have any questions. Quarantine for 5 days if tested positive and no symptoms or 10 days if tested positive and you are with symptoms. Wear a mask around other people.   Health Maintenance, Female Adopting a healthy lifestyle and getting preventive care are important in promoting health and wellness. Ask your health care provider about: The right schedule for you to have regular tests and exams. Things you can do on your own to prevent diseases and keep yourself healthy. What should I know about diet, weight, and exercise? Eat a healthy diet  Eat a diet that includes plenty of vegetables, fruits, low-fat dairy products, and lean protein. Do not eat a lot of foods that are high in solid fats, added sugars, or sodium. Maintain a healthy weight Body mass index (BMI) is used to identify weight problems. It estimates body fat based on height and weight. Your health care provider can help determine your BMI and help you achieve or maintain a healthy weight. Get regular exercise Get regular exercise. This is one of the most important things you can do for your health. Most adults should: Exercise for at least 150 minutes each week. The exercise should increase your heart rate and make you sweat (moderate-intensity exercise). Do strengthening exercises at least twice a week. This is in  addition to the moderate-intensity exercise. Spend less time sitting. Even light physical activity can be beneficial. Watch cholesterol and blood lipids Have your blood tested for lipids and cholesterol at 55 years of age, then have this test every 5 years. Have your cholesterol levels checked more often if: Your lipid or cholesterol levels are high. You are older than 55 years of age. You are at high risk for heart disease. What should I know about cancer screening? Depending on your health history and family history, you may need to have cancer screening at various ages. This may include screening for: Breast cancer. Cervical cancer. Colorectal cancer. Skin cancer. Lung cancer. What should I know about heart disease, diabetes, and high blood pressure? Blood pressure and heart disease High blood pressure causes heart disease and increases the risk of stroke. This is more likely to develop in people who have high blood pressure readings or are overweight. Have your blood pressure checked: Every 3-5 years if you are 17-43 years of age. Every year if you are 41 years old or older. Diabetes Have regular diabetes screenings. This checks your fasting blood sugar level. Have the screening done: Once every three years after age 15 if you are at a normal weight and have a low risk for diabetes. More often and at a younger age if you are overweight or have a high risk for diabetes. What should I know about preventing infection? Hepatitis B If you have a higher risk for hepatitis B, you should be screened for this virus. Talk with your health care provider  to find out if you are at risk for hepatitis B infection. Hepatitis C Testing is recommended for: Everyone born from [birth year] through 1964. Anyone with known risk factors for hepatitis C. Sexually transmitted infections (STIs) Get screened for STIs, including gonorrhea and chlamydia, if: You are sexually active and are younger than 55 years of  age. You are older than 55 years of age and your health care provider tells you that you are at risk for this type of infection. Your sexual activity has changed since you were last screened, and you are at increased risk for chlamydia or gonorrhea. Ask your health care provider if you are at risk. Ask your health care provider about whether you are at high risk for HIV. Your health care provider may recommend a prescription medicine to help prevent HIV infection. If you choose to take medicine to prevent HIV, you should first get tested for HIV. You should then be tested every 3 months for as long as you are taking the medicine. Pregnancy If you are about to stop having your period (premenopausal) and you may become pregnant, seek counseling before you get pregnant. Take 400 to 800 micrograms (mcg) of folic acid every day if you become pregnant. Ask for birth control (contraception) if you want to prevent pregnancy. Osteoporosis and menopause Osteoporosis is a disease in which the bones lose minerals and strength with aging. This can result in bone fractures. If you are 76 years old or older, or if you are at risk for osteoporosis and fractures, ask your health care provider if you should: Be screened for bone loss. Take a calcium or vitamin D supplement to lower your risk of fractures. Be given hormone replacement therapy (HRT) to treat symptoms of menopause. Follow these instructions at home: Alcohol use Do not drink alcohol if: Your health care provider tells you not to drink. You are pregnant, may be pregnant, or are planning to become pregnant. If you drink alcohol: Limit how much you have to: 0-1 drink a day. Know how much alcohol is in your drink. In the [place] one drink equals one 12 oz bottle of beer (355 mL), one 5 oz glass of wine (148 mL), or one 1 oz glass of hard liquor (44 mL). Lifestyle Do not use any products that contain nicotine or tobacco. These products include  cigarettes, chewing tobacco, and vaping devices, such as e-cigarettes. If you need help quitting, ask your health care provider. Do not use street drugs. Do not share needles. Ask your health care provider for help if you need support or information about quitting drugs. General instructions Schedule regular health, dental, and eye exams. Stay current with your vaccines. Tell your health care provider if: You often feel depressed. You have ever been abused or do not feel safe at home. Summary Adopting a healthy lifestyle and getting preventive care are important in promoting health and wellness. Follow your health care provider's instructions about healthy diet, exercising, and getting tested or screened for diseases. Follow your health care provider's instructions on monitoring your cholesterol and blood pressure. This information is not intended to replace advice given to you by your health care provider. Make sure you discuss any questions you have with your health care provider. Document Revised: 11/17/2020 Document Reviewed: 11/17/2020 Elsevier Patient Education  [place].

## 2021-07-26 DIAGNOSIS — Z20822 Contact with and (suspected) exposure to covid-19: Secondary | ICD-10-CM | POA: Diagnosis not present

## 2021-07-27 ENCOUNTER — Encounter: Payer: Self-pay | Admitting: Internal Medicine

## 2021-07-29 ENCOUNTER — Other Ambulatory Visit: Payer: Self-pay | Admitting: Internal Medicine

## 2021-07-29 DIAGNOSIS — Z1231 Encounter for screening mammogram for malignant neoplasm of breast: Secondary | ICD-10-CM

## 2021-07-31 DIAGNOSIS — Z20822 Contact with and (suspected) exposure to covid-19: Secondary | ICD-10-CM | POA: Diagnosis not present

## 2021-08-03 DIAGNOSIS — Z01419 Encounter for gynecological examination (general) (routine) without abnormal findings: Secondary | ICD-10-CM | POA: Diagnosis not present

## 2021-08-03 DIAGNOSIS — Z90710 Acquired absence of both cervix and uterus: Secondary | ICD-10-CM | POA: Diagnosis not present

## 2021-08-06 DIAGNOSIS — Z1231 Encounter for screening mammogram for malignant neoplasm of breast: Secondary | ICD-10-CM

## 2021-08-22 ENCOUNTER — Other Ambulatory Visit: Payer: Self-pay | Admitting: Internal Medicine

## 2021-10-08 ENCOUNTER — Ambulatory Visit
Admission: RE | Admit: 2021-10-08 | Discharge: 2021-10-08 | Disposition: A | Discharge status: Home / Self Care | Payer: BC Managed Care – PPO | Source: Ambulatory Visit

## 2021-10-08 DIAGNOSIS — Z1231 Encounter for screening mammogram for malignant neoplasm of breast: Secondary | ICD-10-CM

## 2021-10-19 ENCOUNTER — Other Ambulatory Visit: Payer: Self-pay | Admitting: Internal Medicine

## 2021-12-22 ENCOUNTER — Other Ambulatory Visit: Payer: Self-pay | Admitting: Internal Medicine

## 2022-01-26 ENCOUNTER — Telehealth: Payer: BC Managed Care – PPO | Admitting: Emergency Medicine

## 2022-01-26 DIAGNOSIS — H9209 Otalgia, unspecified ear: Secondary | ICD-10-CM

## 2022-01-26 MED ORDER — AMOXICILLIN-POT CLAVULANATE 875-125 MG PO TABS: 1.0000 | ORAL_TABLET | Freq: Two times a day (BID) | ORAL | 0 refills | Status: DC

## 2022-01-26 NOTE — Progress Notes (Signed)
E-Visit for Sinus/Ear/Cough Problems  We are sorry that you are not feeling well.  Here is how we plan to help!  Based on what you have shared with me it looks like you have sinusitis.  Sinusitis is inflammation and infection in the sinus cavities of the head.  Based on your presentation I believe you most likely have Acute Bacterial Sinusitis.  This is an infection caused by bacteria and is treated with antibiotics. I have prescribed Augmentin '875mg'$ /'125mg'$  one tablet twice daily with food, for 7 days. You may use an oral decongestant such as Mucinex D or if you have glaucoma or high blood pressure use plain Mucinex. Saline nasal spray help and can safely be used as often as needed for congestion.  If you develop worsening sinus pain, fever or notice severe headache and vision changes, or if symptoms are not better after completion of antibiotic, please schedule an appointment with a health care provider.    Sinus infections are not as easily transmitted as other respiratory infection, however we still recommend that you avoid close contact with loved ones, especially the very young and elderly.  Remember to wash your hands thoroughly throughout the day as this is the number one way to prevent the spread of infection!  You can take over the counter pain medications, but the ear pain will take time to resolve.  The antibiotic should help with this, but it will take several days.  Home Care: Only take medications as instructed by your medical team. Complete the entire course of an antibiotic. Do not take these medications with alcohol. A steam or ultrasonic humidifier can help congestion.  You can place a towel over your head and breathe in the steam from hot water coming from a faucet. Avoid close contacts especially the very young and the elderly. Cover your mouth when you cough or sneeze. Always remember to wash your hands.  Get Help Right Away If: You develop worsening fever or sinus pain. You  develop a severe head ache or visual changes. Your symptoms persist after you have completed your treatment plan.  Make sure you Understand these instructions. Will watch your condition. Will get help right away if you are not doing well or get worse.  Thank you for choosing an e-visit.  Your e-visit answers were reviewed by a board certified advanced clinical practitioner to complete your personal care plan. Depending upon the condition, your plan could have included both over the counter or prescription medications.  Please review your pharmacy choice. Make sure the pharmacy is open so you can pick up prescription now. If there is a problem, you may contact your provider through [employer] and have the prescription routed to another pharmacy.  Your safety is important to [place]. If you have drug allergies check your prescription carefully.   For the next 24 hours you can use MyChart to ask questions about today's visit, request a non-urgent call back, or ask for a work or school excuse. You will get an email in the next two days asking about your experience. I hope that your e-visit has been valuable and will speed your recovery.  Approximately 5 minutes was used in reviewing the patient's chart, questionnaire, prescribing medications, and documentation.

## 2022-02-17 ENCOUNTER — Encounter: Payer: Self-pay | Admitting: Internal Medicine

## 2022-02-18 ENCOUNTER — Ambulatory Visit: Payer: BC Managed Care – PPO | Admitting: Nurse Practitioner

## 2022-05-11 ENCOUNTER — Other Ambulatory Visit: Payer: Self-pay | Admitting: Nurse Practitioner

## 2022-05-11 ENCOUNTER — Other Ambulatory Visit: Payer: Self-pay | Admitting: Internal Medicine

## 2022-05-20 ENCOUNTER — Telehealth: Payer: BC Managed Care – PPO | Admitting: Physician Assistant

## 2022-05-20 DIAGNOSIS — K2100 Gastro-esophageal reflux disease with esophagitis, without bleeding: Secondary | ICD-10-CM

## 2022-05-20 NOTE — Progress Notes (Signed)
E-Visit for Heartburn  We are sorry that you are not feeling well.  Here is how we plan to help!  Based on what you shared with me it looks like you most likely have Gastroesophageal Reflux Disease (GERD)  Gastroesophageal reflux disease (GERD) happens when acid from your stomach flows up into the esophagus.  When acid comes in contact with the esophagus, the acid causes sorenss (inflammation) in the esophagus.  Over time, GERD may create small holes (ulcers) in the lining of the esophagus.  I want you to continue use of your omeprazole as directed by your provider, adding on a nightly OTC famotidine (pepcid) for the rest of the week to help further reduce acid production and reflux. Elevate head of bed and avoid late-night eating. You need to follow dietary recommendations below. Avoid ibuprofen and Aleve as they will make reflux worse. Switch to tylenol if needed and start salt-water gargles. Follow-up with your PCP if symptoms are not improving/resolving.   Your symptoms should improve in the next day or two.  You can use antacids as needed until symptoms resolve.  Call us if your heartburn worsens, you have trouble swallowing, weight loss, spitting up blood or recurrent vomiting.  Home Care: May include lifestyle changes such as weight loss, quitting smoking and alcohol consumption Avoid foods and drinks that make your symptoms worse, such as: Caffeine or alcoholic drinks Chocolate Peppermint or mint flavorings Garlic and onions Spicy foods Citrus fruits, such as oranges, lemons, or limes Tomato-based foods such as sauce, chili, salsa and pizza Fried and fatty foods Avoid lying down for 3 hours prior to your bedtime or prior to taking a nap Eat small, frequent meals instead of a large meals Wear loose-fitting clothing.  Do not wear anything tight around your waist that causes pressure on your stomach. Raise the head of your bed 6 to 8 inches with wood blocks to help you sleep.  Extra  pillows will not help.  Seek Help Right Away If: You have pain in your arms, neck, jaw, teeth or back Your pain increases or changes in intensity or duration You develop nausea, vomiting or sweating (diaphoresis) You develop shortness of breath or you faint Your vomit is green, yellow, black or looks like coffee grounds or blood Your stool is red, bloody or black  These symptoms could be signs of other problems, such as heart disease, gastric bleeding or esophageal bleeding.  Make sure you : Understand these instructions. Will watch your condition. Will get help right away if you are not doing well or get worse.  Your e-visit answers were reviewed by a board certified advanced clinical practitioner to complete your personal care plan.  Depending on the condition, your plan could have included both over the counter or prescription medications.  If there is a problem please reply  once you have received a response from your provider.  Your safety is important to [place].  If you have drug allergies check your prescription carefully.    You can use MyChart to ask questions about today's visit, request a non-urgent call back, or ask for a work or school excuse for 24 hours related to this e-Visit. If it has been greater than 24 hours you will need to follow up with your provider, or enter a new e-Visit to address those concerns.  You will get an e-mail in the next two days asking about your experience.  I hope that your e-visit has been valuable and will speed your  recovery. Thank you for using e-visits.

## 2022-05-20 NOTE — Progress Notes (Signed)
Message sent to patient requesting further input regarding current symptoms. Awaiting patient response.  

## 2022-05-20 NOTE — Progress Notes (Signed)
I have spent 5 minutes in review of e-visit questionnaire, review and updating patient chart, medical decision making and response to patient.   Oluwasemilore Pascuzzi Cody Quetzali Heinle, PA-C    

## 2022-06-03 DIAGNOSIS — I959 Hypotension, unspecified: Secondary | ICD-10-CM | POA: Diagnosis not present

## 2022-06-03 DIAGNOSIS — R1084 Generalized abdominal pain: Secondary | ICD-10-CM | POA: Diagnosis not present

## 2022-06-29 DIAGNOSIS — J301 Allergic rhinitis due to pollen: Secondary | ICD-10-CM | POA: Diagnosis not present

## 2022-06-29 DIAGNOSIS — J45998 Other asthma: Secondary | ICD-10-CM | POA: Diagnosis not present

## 2022-06-29 DIAGNOSIS — J4530 Mild persistent asthma, uncomplicated: Secondary | ICD-10-CM | POA: Diagnosis not present

## 2022-06-29 DIAGNOSIS — J3089 Other allergic rhinitis: Secondary | ICD-10-CM | POA: Diagnosis not present

## 2022-08-03 ENCOUNTER — Ambulatory Visit (INDEPENDENT_AMBULATORY_CARE_PROVIDER_SITE_OTHER): Payer: BC Managed Care – PPO | Admitting: Internal Medicine

## 2022-08-03 ENCOUNTER — Encounter: Payer: Self-pay | Admitting: Internal Medicine

## 2022-08-03 VITALS — BP 118/78 | Temp 97.6°F | Ht 64.2 in | Wt 188.0 lb

## 2022-08-03 DIAGNOSIS — Z800 Family history of malignant neoplasm of digestive organs: Secondary | ICD-10-CM | POA: Diagnosis not present

## 2022-08-03 DIAGNOSIS — Z6832 Body mass index (BMI) 32.0-32.9, adult: Secondary | ICD-10-CM | POA: Diagnosis not present

## 2022-08-03 DIAGNOSIS — R7309 Other abnormal glucose: Secondary | ICD-10-CM | POA: Diagnosis not present

## 2022-08-03 DIAGNOSIS — Z0000 Encounter for general adult medical examination without abnormal findings: Secondary | ICD-10-CM

## 2022-08-03 DIAGNOSIS — R635 Abnormal weight gain: Secondary | ICD-10-CM | POA: Diagnosis not present

## 2022-08-03 DIAGNOSIS — E6609 Other obesity due to excess calories: Secondary | ICD-10-CM

## 2022-08-03 DIAGNOSIS — Z8052 Family history of malignant neoplasm of bladder: Secondary | ICD-10-CM

## 2022-08-03 LAB — POCT URINALYSIS DIPSTICK
Bilirubin, UA: NEGATIVE
Glucose, UA: NEGATIVE
Ketones, UA: NEGATIVE
Leukocytes, UA: NEGATIVE
Nitrite, UA: NEGATIVE
Protein, UA: NEGATIVE
Spec Grav, UA: 1.015 (ref 1.010–1.025)
Urobilinogen, UA: 0.2 E.U./dL
pH, UA: 6 (ref 5.0–8.0)

## 2022-08-03 LAB — CMP14+EGFR
ALT: 11 IU/L (ref 0–32)
AST: 17 IU/L (ref 0–40)
Albumin/Globulin Ratio: 1.3 (ref 1.2–2.2)
Albumin: 4.3 g/dL (ref 3.8–4.9)
Alkaline Phosphatase: 104 IU/L (ref 44–121)
BUN/Creatinine Ratio: 12 (ref 9–23)
BUN: 10 mg/dL (ref 6–24)
Bilirubin Total: 0.2 mg/dL (ref 0.0–1.2)
CO2: 24 mmol/L (ref 20–29)
Calcium: 9.9 mg/dL (ref 8.7–10.2)
Chloride: 101 mmol/L (ref 96–106)
Creatinine, Ser: 0.81 mg/dL (ref 0.57–1.00)
Globulin, Total: 3.2 g/dL (ref 1.5–4.5)
Glucose: 90 mg/dL (ref 70–99)
Potassium: 4.6 mmol/L (ref 3.5–5.2)
Sodium: 140 mmol/L (ref 134–144)
Total Protein: 7.5 g/dL (ref 6.0–8.5)
eGFR: 86 mL/min/{1.73_m2} (ref >59)

## 2022-08-03 LAB — CBC
Hematocrit: 44.1 % (ref 34.0–46.6)
Hemoglobin: 14.5 g/dL (ref 11.1–15.9)
MCH: 27.5 pg (ref 26.6–33.0)
MCHC: 32.9 g/dL (ref 31.5–35.7)
MCV: 84 fL (ref 79–97)
Platelets: 391 10*3/uL (ref 150–450)
RBC: 5.27 x10E6/uL (ref 3.77–5.28)
RDW: 12 % (ref 11.7–15.4)
WBC: 7.6 10*3/uL (ref 3.4–10.8)

## 2022-08-03 LAB — HEMOGLOBIN A1C
Est. average glucose Bld gHb Est-mCnc: 137 mg/dL
Hgb A1c MFr Bld: 6.4 % — ABNORMAL HIGH (ref 4.8–5.6)

## 2022-08-03 NOTE — Progress Notes (Signed)
Rich Brave [place],acting as a Education administrator for Maximino Greenland, MD.,have documented all relevant documentation on the behalf of Maximino Greenland, MD,as directed by  Maximino Greenland, MD while in the presence of Maximino Greenland, MD.   Subjective:     Patient ID: Samantha Mcgee , female    DOB: 02-12-1966 , 57 y.o.   MRN: [id number]   Chief Complaint  Patient presents with   Annual Exam    HPI  She is here today for a full physical examination.  She is followed by Dr. Garwin Brothers for her GYN exams. Patient reports having one brother who died of colon cancer and another one died of bladder cancer. She has no specific concerns or complaints at this time.      Past Medical History:  Diagnosis Date   Anxiety    Arthritis    Asthma    Headache    History of kidney stones    Interstitial cystitis    Seasonal allergies      Family History  Problem Relation Age of Onset   Diabetes Mother    Stroke Mother    Hyperlipidemia Mother    Arthritis Father    Heart attack Father    Hyperlipidemia Father    Eczema Brother    Cancer Brother    Eczema Daughter    Allergic rhinitis Neg Hx    Angioedema Neg Hx    Asthma Neg Hx    Immunodeficiency Neg Hx    Urticaria Neg Hx      Current Outpatient Medications:    albuterol (PROAIR HFA) 108 (90 Base) MCG/ACT inhaler, Use 2 puffs every 4 hours as needed for cough or wheeze.  May use 2 puffs 10-20 minutes prior to exercise., Disp: 1 Inhaler, Rfl: 1   amitriptyline (ELAVIL) 10 MG tablet, TAKE 3 TABLETS BY MOUTH AT BEDTIME AS NEEDED FOR SLEEP, Disp: 270 tablet, Rfl: 2   CALCIUM PO, Take by mouth., Disp: , Rfl:    cetirizine (ZYRTEC) 10 MG tablet, TAKE 1 TABLET BY MOUTH EVERY DAY FOR RUNNY NOSE OR ITCHING (Patient taking differently: Take 10 mg by mouth daily.), Disp: 90 tablet, Rfl: 1   Clindamycin-Benzoyl Per, Refr, gel, Apply 1 application topically daily., Disp: , Rfl: 3   IBUPROFEN PO, Take 800 mg by mouth., Disp: , Rfl:    montelukast  (SINGULAIR) 10 MG tablet, Take 1 tablet (10 mg total) by mouth at bedtime., Disp: 30 tablet, Rfl: 5   Omega-3 1000 MG CAPS, Take 1,000 mg by mouth daily., Disp: , Rfl:    omeprazole (PRILOSEC) 40 MG capsule, TAKE 1 CAPSULE BY MOUTH TWICE A DAY 30 MINUTES BEFORE MORNING AND EVENING MEALS, Disp: 180 capsule, Rfl: 1   OVER THE COUNTER MEDICATION, Vit D 1000 daily, Disp: , Rfl:    polyethylene glycol (MIRALAX / GLYCOLAX) 17 g packet, Take 17 g by mouth daily. daily, Disp: , Rfl:    Probiotic Product (PROBIOTIC PO), Take 1 capsule by mouth 2 (two) times daily. ULTRA FLORA, Disp: , Rfl:    RETIN-A MICRO PUMP 0.08 % GEL, Apply 1 application topically at bedtime., Disp: , Rfl: 1   vitamin B-12 (CYANOCOBALAMIN) 500 MCG tablet, Take 500 mcg by mouth daily. , Disp: , Rfl:    WIXELA INHUB 250-50 MCG/ACT AEPB, Inhale 1 puff into the lungs 2 (two) times daily., Disp: , Rfl:    Allergies  Allergen Reactions   Latex Other (See Comments)    Blisters.  The patient states she uses none for birth control. Last LMP was Patient's last menstrual period was 03/06/2018.. Negative for Dysmenorrhea. Negative for: breast discharge, breast lump(s), breast pain and breast self exam. Associated symptoms include abnormal vaginal bleeding. Pertinent negatives include abnormal bleeding (hematology), anxiety, decreased libido, depression, difficulty falling sleep, dyspareunia, history of infertility, nocturia, sexual dysfunction, sleep disturbances, urinary incontinence, urinary urgency, vaginal discharge and vaginal itching. Diet regular.    . The patient's tobacco use is:  Social History   Tobacco Use  Smoking Status Never  Smokeless Tobacco Never  . She has been exposed to passive smoke. The patient's alcohol use is:  Social History   Substance and Sexual Activity  Alcohol Use Yes   Alcohol/week: 1.0 standard drink of alcohol   Types: 1 Glasses of wine per week   Comment: weekly    Review of Systems   Constitutional: Negative.   HENT: Negative.    Eyes: Negative.   Respiratory: Negative.    Cardiovascular: Negative.   Gastrointestinal: Negative.   Endocrine: Negative.   Genitourinary: Negative.   Musculoskeletal: Negative.   Skin: Negative.   Allergic/Immunologic: Negative.   Neurological: Negative.   Hematological: Negative.   Psychiatric/Behavioral: Negative.       Today's Vitals   08/03/22 1010  BP: 118/78  Temp: 97.6 F (36.4 C)  Weight: 188 lb (85.3 kg)  Height: 5' 4.2" (1.631 m)  PainSc: 0-No pain   Body mass index is 32.07 kg/m.  Wt Readings from Last 3 Encounters:  08/03/22 188 lb (85.3 kg)  07/23/21 194 lb 6.4 oz (88.2 kg)  07/03/20 192 lb (87.1 kg)     Objective:  Physical Exam Vitals and nursing note reviewed.  Constitutional:      Appearance: Normal appearance.  HENT:     Head: Normocephalic and atraumatic.     Right Ear: Tympanic membrane, ear canal and external ear normal.     Left Ear: Tympanic membrane, ear canal and external ear normal.     Nose:     Comments: Masked     Mouth/Throat:     Comments: Masked  Eyes:     Extraocular Movements: Extraocular movements intact.     Conjunctiva/sclera: Conjunctivae normal.     Pupils: Pupils are equal, round, and reactive to light.  Cardiovascular:     Rate and Rhythm: Normal rate and regular rhythm.     Pulses: Normal pulses.     Heart sounds: Normal heart sounds.  Pulmonary:     Effort: Pulmonary effort is normal.     Breath sounds: Normal breath sounds.  Chest:  Breasts:    Tanner Score is 5.     Right: Normal.     Left: Normal.  Abdominal:     General: Bowel sounds are normal.     Palpations: Abdomen is soft.  Genitourinary:    Comments: deferred Musculoskeletal:        General: Normal range of motion.     Cervical back: Normal range of motion and neck supple.  Skin:    General: Skin is warm and dry.  Neurological:     General: No focal deficit present.     Mental Status: She is  alert and oriented to person, place, and time.  Psychiatric:        Mood and Affect: Mood normal.        Behavior: Behavior normal.       Assessment And Plan:     1. Encounter for general adult  medical examination w/o abnormal findings Comments: A full exam was performed.  Importance of monthly self breast exams was discussed with the patient.  PATIENT IS ADVISED TO GET 30-45 MINUTES REGULAR EXERCISE NO LESS THAN FOUR TO FIVE DAYS PER WEEK - BOTH WEIGHTBEARING EXERCISES AND AEROBIC ARE RECOMMENDED.  PATIENT IS ADVISED TO FOLLOW A HEALTHY DIET WITH AT LEAST SIX FRUITS/VEGGIES PER DAY, DECREASE INTAKE OF RED MEAT, AND TO INCREASE FISH INTAKE TO TWO DAYS PER WEEK.  MEATS/FISH SHOULD NOT BE FRIED, BAKED OR BROILED IS PREFERABLE.  IT IS ALSO IMPORTANT TO CUT BACK ON YOUR SUGAR INTAKE. PLEASE AVOID ANYTHING WITH ADDED SUGAR, CORN SYRUP OR OTHER SWEETENERS. IF YOU MUST USE A SWEETENER, YOU CAN TRY STEVIA. IT IS ALSO IMPORTANT TO AVOID ARTIFICIALLY SWEETENERS AND DIET BEVERAGES. LASTLY, I SUGGEST WEARING SPF 50 SUNSCREEN ON EXPOSED PARTS AND ESPECIALLY WHEN IN THE DIRECT SUNLIGHT FOR AN EXTENDED PERIOD OF TIME.  PLEASE AVOID FAST FOOD RESTAURANTS AND INCREASE YOUR WATER INTAKE. - CMP14+EGFR - CBC - Hemoglobin A1c - Lipid panel  2. Class 1 obesity due to excess calories without serious comorbidity with body mass index (BMI) of 32.0 to 32.9 in adult Comments: She is encouraged to aim for at least 150 minutes of exercise/week, while initially striving for BMI<30 to decrease cardiac risk.  3. Family history of colon cancer Comments: Last colonoscopy was in April 2022. She is due in 2025. I will send her with stool card to complete at home.  4. Family history of bladder cancer - POCT Urinalysis Dipstick (82800)  Patient was given opportunity to ask questions. Patient verbalized understanding of the plan and was able to repeat key elements of the plan. All questions were answered to their satisfaction.    I, Maximino Greenland, MD, have reviewed all documentation for this visit. The documentation on 08/03/22 for the exam, diagnosis, procedures, and orders are all accurate and complete.  THE PATIENT IS ENCOURAGED TO PRACTICE SOCIAL DISTANCING DUE TO THE COVID-19 PANDEMIC.

## 2022-08-03 NOTE — Patient Instructions (Signed)

## 2022-08-04 LAB — LIPID PANEL
Chol/HDL Ratio: 3.7 ratio (ref 0.0–4.4)
Cholesterol, Total: 183 mg/dL (ref 100–199)
HDL: 49 mg/dL (ref >39)
LDL Chol Calc (NIH): 123 mg/dL — ABNORMAL HIGH (ref 0–99)
Triglycerides: 60 mg/dL (ref 0–149)
VLDL Cholesterol Cal: 11 mg/dL (ref 5–40)

## 2022-11-16 ENCOUNTER — Other Ambulatory Visit: Payer: Self-pay | Admitting: Nurse Practitioner

## 2022-11-22 DIAGNOSIS — R350 Frequency of micturition: Secondary | ICD-10-CM | POA: Diagnosis not present

## 2022-11-22 DIAGNOSIS — R102 Pelvic and perineal pain: Secondary | ICD-10-CM | POA: Diagnosis not present

## 2022-11-22 DIAGNOSIS — Z87442 Personal history of urinary calculi: Secondary | ICD-10-CM | POA: Diagnosis not present

## 2022-12-20 DIAGNOSIS — J3089 Other allergic rhinitis: Secondary | ICD-10-CM | POA: Diagnosis not present

## 2022-12-20 DIAGNOSIS — J4530 Mild persistent asthma, uncomplicated: Secondary | ICD-10-CM | POA: Diagnosis not present

## 2022-12-20 DIAGNOSIS — J301 Allergic rhinitis due to pollen: Secondary | ICD-10-CM | POA: Diagnosis not present

## 2022-12-24 ENCOUNTER — Other Ambulatory Visit: Payer: Self-pay | Admitting: Internal Medicine

## 2023-01-10 ENCOUNTER — Ambulatory Visit (INDEPENDENT_AMBULATORY_CARE_PROVIDER_SITE_OTHER): Payer: BC Managed Care – PPO | Admitting: Internal Medicine

## 2023-01-10 ENCOUNTER — Encounter: Payer: Self-pay | Admitting: Internal Medicine

## 2023-01-10 VITALS — BP 112/80 | HR 90 | Temp 98.1°F | Ht 61.4 in | Wt 184.6 lb

## 2023-01-10 DIAGNOSIS — F5104 Psychophysiologic insomnia: Secondary | ICD-10-CM

## 2023-01-10 DIAGNOSIS — M25562 Pain in left knee: Secondary | ICD-10-CM | POA: Diagnosis not present

## 2023-01-10 DIAGNOSIS — G8929 Other chronic pain: Secondary | ICD-10-CM | POA: Insufficient documentation

## 2023-01-10 DIAGNOSIS — E6609 Other obesity due to excess calories: Secondary | ICD-10-CM | POA: Insufficient documentation

## 2023-01-10 DIAGNOSIS — Z6834 Body mass index (BMI) 34.0-34.9, adult: Secondary | ICD-10-CM

## 2023-01-10 DIAGNOSIS — R7303 Prediabetes: Secondary | ICD-10-CM | POA: Diagnosis not present

## 2023-01-10 DIAGNOSIS — E66811 Obesity, class 1: Secondary | ICD-10-CM

## 2023-01-10 LAB — TSH: TSH: 2.73 u[IU]/mL (ref 0.450–4.500)

## 2023-01-10 LAB — BASIC METABOLIC PANEL
BUN/Creatinine Ratio: 11 (ref 9–23)
BUN: 9 mg/dL (ref 6–24)
CO2: 24 mmol/L (ref 20–29)
Calcium: 9.8 mg/dL (ref 8.7–10.2)
Chloride: 101 mmol/L (ref 96–106)
Creatinine, Ser: 0.84 mg/dL (ref 0.57–1.00)
Glucose: 95 mg/dL (ref 70–99)
Potassium: 4.6 mmol/L (ref 3.5–5.2)
Sodium: 139 mmol/L (ref 134–144)
eGFR: 82 mL/min/{1.73_m2} (ref >59)

## 2023-01-10 LAB — HEMOGLOBIN A1C
Est. average glucose Bld gHb Est-mCnc: 120 mg/dL
Hgb A1c MFr Bld: 5.8 % — ABNORMAL HIGH (ref 4.8–5.6)

## 2023-01-10 MED ORDER — MELOXICAM 7.5 MG PO TABS: 7.5000 mg | ORAL_TABLET | Freq: Every day | ORAL | 1 refills | Status: DC

## 2023-01-10 MED ORDER — ZOLPIDEM TARTRATE 5 MG PO TABS: ORAL_TABLET | ORAL | 0 refills | Status: DC

## 2023-01-10 NOTE — Progress Notes (Signed)
I,Samantha Mcgee, CMA,acting as a Neurosurgeon for Samantha Aliment, MD.,have documented all relevant documentation on the behalf of Samantha Aliment, MD,as directed by  Samantha Aliment, MD while in the presence of Samantha Aliment, MD.  Subjective:  Patient ID: Samantha Mcgee , female    DOB: 11-Sep-1965 , 57 y.o.   MRN: [id number]  Chief Complaint  Patient presents with   Prediabetes    HPI  Patient presents today for prediabetes check. Patient reports she has a cyst in the back of her knee. She reports having pain when she is sitting for long periods of time. She admits she did a lot of walking at [place] about two weeks ago. Denies fall/trauma. She does have h/o Baker's cyst.   She would also like a refill of her Ambien. She uses this less than once weekly.     Past Medical History:  Diagnosis Date   Anxiety    Arthritis    Asthma    Headache    History of kidney stones    Interstitial cystitis    Seasonal allergies      Family History  Problem Relation Age of Onset   Diabetes Mother    Stroke Mother    Hyperlipidemia Mother    Arthritis Father    Heart attack Father    Hyperlipidemia Father    Eczema Brother    Cancer Brother    Eczema Daughter    Allergic rhinitis Neg Hx    Angioedema Neg Hx    Asthma Neg Hx    Immunodeficiency Neg Hx    Urticaria Neg Hx      Current Outpatient Medications:    albuterol (PROAIR HFA) 108 (90 Base) MCG/ACT inhaler, Use 2 puffs every 4 hours as needed for cough or wheeze.  May use 2 puffs 10-20 minutes prior to exercise., Disp: 1 Inhaler, Rfl: 1   amitriptyline (ELAVIL) 10 MG tablet, TAKE 3 TABLETS BY MOUTH AT BEDTIME AS NEEDED FOR SLEEP, Disp: 270 tablet, Rfl: 2   CALCIUM PO, Take by mouth., Disp: , Rfl:    cetirizine (ZYRTEC) 10 MG tablet, TAKE 1 TABLET BY MOUTH EVERY DAY FOR RUNNY NOSE OR ITCHING (Patient taking differently: Take 10 mg by mouth daily.), Disp: 90 tablet, Rfl: 1   Clindamycin-Benzoyl Per, Refr, gel, Apply 1  application topically daily., Disp: , Rfl: 3   meloxicam (MOBIC) 7.5 MG tablet, Take 1 tablet (7.5 mg total) by mouth daily. prn, Disp: 30 tablet, Rfl: 1   montelukast (SINGULAIR) 10 MG tablet, Take 1 tablet (10 mg total) by mouth at bedtime., Disp: 30 tablet, Rfl: 5   Omega-3 1000 MG CAPS, Take 1,000 mg by mouth daily., Disp: , Rfl:    omeprazole (PRILOSEC) 40 MG capsule, TAKE 1 CAPSULE BY MOUTH TWICE A DAY 30 MINUTES BEFORE MORNING AND EVENING MEALS, Disp: 180 capsule, Rfl: 1   OVER THE COUNTER MEDICATION, Vit D 1000 daily, Disp: , Rfl:    polyethylene glycol (MIRALAX / GLYCOLAX) 17 g packet, Take 17 g by mouth daily. daily, Disp: , Rfl:    Probiotic Product (PROBIOTIC PO), Take 1 capsule by mouth 2 (two) times daily. ULTRA FLORA, Disp: , Rfl:    RETIN-A MICRO PUMP 0.08 % GEL, Apply 1 application topically at bedtime., Disp: , Rfl: 1   vitamin B-12 (CYANOCOBALAMIN) 500 MCG tablet, Take 500 mcg by mouth daily. , Disp: , Rfl:    WIXELA INHUB 250-50 MCG/ACT AEPB, Inhale 1 puff into the  lungs 2 (two) times daily., Disp: , Rfl:    zolpidem (AMBIEN) 5 MG tablet, TAKE 1 TABLET BY MOUTH EVERY DAY AT BEDTIME AS NEEDED, Disp: 30 tablet, Rfl: 0   Allergies  Allergen Reactions   Latex Other (See Comments)    Blisters.     Review of Systems  Constitutional: Negative.   Eyes: Negative.   Respiratory: Negative.    Cardiovascular: Negative.   Gastrointestinal: Negative.   Endocrine: Negative for polydipsia, polyphagia and polyuria.  Musculoskeletal:  Positive for arthralgias.  Skin: Negative.   Neurological: Negative.   Psychiatric/Behavioral: Negative.       Today's Vitals   01/10/23 0832  BP: 112/80  Pulse: 90  Temp: 98.1 F (36.7 C)  Weight: 184 lb 9.6 oz (83.7 kg)  Height: 5' 1.4" (1.56 m)  PainSc: 5   PainLoc: Leg   Body mass index is 34.43 kg/m.  Wt Readings from Last 3 Encounters:  01/10/23 184 lb 9.6 oz (83.7 kg)  08/03/22 188 lb (85.3 kg)  07/23/21 194 lb 6.4 oz (88.2 kg)     The 10-year ASCVD risk score (Arnett DK, et al., 2019) is: 2.1%   Values used to calculate the score:     Age: 18 years     Sex: Female     Is Non-Hispanic African American: Yes     Diabetic: No     Tobacco smoker: No     Systolic Blood Pressure: 112 mmHg     Is BP treated: No     HDL Cholesterol: 49 mg/dL     Total Cholesterol: 183 mg/dL  Objective:  Physical Exam Vitals and nursing note reviewed.  Constitutional:      Appearance: Normal appearance.  HENT:     Head: Normocephalic and atraumatic.  Eyes:     Extraocular Movements: Extraocular movements intact.  Cardiovascular:     Rate and Rhythm: Normal rate and regular rhythm.     Heart sounds: Normal heart sounds.  Pulmonary:     Effort: Pulmonary effort is normal.     Breath sounds: Normal breath sounds.  Musculoskeletal:     Cervical back: Normal range of motion.  Skin:    General: Skin is warm.  Neurological:     General: No focal deficit present.     Mental Status: She is alert.  Psychiatric:        Mood and Affect: Mood normal.        Behavior: Behavior normal.         Assessment And Plan:  1. Prediabetes Comments: Previous labs reviewed, her a1c has been elevated in the past. - Hemoglobin A1c - Basic metabolic panel - TSH  2. Chronic insomnia Comments: She was given rx zolpidem to use nightly prn.  3. Posterior knee pain, left Comments: She has h/o Baker's cyst. I will send rx meloxicam to use daily prn. Advised to take with food.  4. Class 1 obesity due to excess calories with body mass index (BMI) of 34.0 to 34.9 in adult, unspecified whether serious comorbidity present Comments: She is aware of 10lb loss in the past year.  She is encouraged to aim for at least 150 minutes of exercise per week.    Return if symptoms worsen or fail to improve.  Patient was given opportunity to ask questions. Patient verbalized understanding of the plan and was able to repeat key elements of the plan. All  questions were answered to their satisfaction.    I, Samantha Aliment,  MD, have reviewed all documentation for this visit. The documentation on 01/10/23 for the exam, diagnosis, procedures, and orders are all accurate and complete.    IF YOU HAVE BEEN REFERRED TO A SPECIALIST, IT MAY TAKE 1-2 WEEKS TO SCHEDULE/PROCESS THE REFERRAL. IF YOU HAVE NOT HEARD FROM US/SPECIALIST IN TWO WEEKS, PLEASE GIVE [place] A CALL AT [phone].

## 2023-01-10 NOTE — Patient Instructions (Signed)
Baker Cyst  A Baker cyst, also called a popliteal cyst, is a growth that forms at the back of the knee. The cyst forms when the fluid-filled sac (bursa) behind the knee joint fills up with more fluid and becomes enlarged. What are the causes? In most cases, a Baker cyst results from another knee problem that causes swelling in the knee. This makes the fluid inside the knee joint (synovial fluid) flow into the bursa behind the knee, causing the bursa to enlarge. The fluid flows in one direction and is unable to move back into the joint. What increases the risk? You may be more likely to develop a Baker cyst if you already have a knee problem, such as: A tear in cartilage that cushions the knee joint (meniscal tear). A tear in the tissues that connect the bones of the knee joint (ligament tear). Knee swelling from osteoarthritis, rheumatoid arthritis, or gout. What are the signs or symptoms? The main symptom of this condition is a lump behind the knee. This may be the only symptom of the condition. The lump may be painful, especially when the knee is straightened. If the lump is painful, the pain may come and go. The knee may also be stiff. Symptoms may quickly get more severe if the cyst breaks open. If the cyst breaks open, you may feel the following in your knee and calf: Sudden or worsening pain. Swelling. Bruising. Redness in the calf. A Baker cyst does not always cause symptoms. How is this diagnosed? This condition may be diagnosed based on your symptoms and medical history. Your health care provider will also do a physical exam. This may include: Feeling the cyst to check whether it is tender. Checking your knee for signs of another knee condition that causes swelling. You may have imaging tests, such as X-ray, ultrasound, or MRI. How is this treated? A Baker cyst that is not painful may go away without treatment. If the cyst gets large or painful, it will likely get better if the  underlying knee problem is treated. If needed, treatment for a Baker cyst may include: Resting. Keeping weight off the knee. This means not leaning on the knee to support your body weight. Taking NSAIDs, such as ibuprofen, to reduce pain and swelling. Draining the fluid from the cyst with a needle (aspiration). Getting a steroid injection into the knee. A steroid reduces swelling. Surgery. This may be needed if other treatments do not work. This usually involves correcting knee damage and removing the cyst. In some cases, you may also need to do certain exercises to improve movement in the knee (physical therapy). Follow these instructions at home: Activity Rest as told by your provider. Avoid activities that make pain or swelling worse. Do not use the injured limb to support your body weight until your provider says that you can. Use crutches as told by your provider. Raise (elevate) your knee above the level of your heart while you are sitting or lying down. Do physical therapy exercises as told by your provider. Return to your normal activities as told by your provider. Ask your provider what activities are safe for you. General instructions Take over-the-counter and prescription medicines only as told by your provider. Keep all follow-up visits. Your provider will monitor your healing and adjust your treatment as needed. Contact a health care provider if: You have knee pain, stiffness, or swelling that does not get better. Get help right away if: You have sudden or worsening pain and   swelling in your calf area. This information is not intended to replace advice given to you by your health care provider. Make sure you discuss any questions you have with your health care provider. Document Revised: 02/24/2022 Document Reviewed: 02/24/2022 Elsevier Patient Education  2024 [employer]  

## 2023-01-18 NOTE — Therapy (Unsigned)
OUTPATIENT PHYSICAL THERAPY FEMALE PELVIC EVALUATION   Patient Name: Samantha Mcgee MRN: [id number] DOB:Oct 24, 1965, 57 y.o., female Today's Date: 01/18/2023  END OF SESSION:   Past Medical History:  Diagnosis Date   Anxiety    Arthritis    Asthma    Headache    History of kidney stones    Interstitial cystitis    Seasonal allergies    Past Surgical History:  Procedure Laterality Date   BREAST BIOPSY Right 09/11/2019   fibrocystic change   CESAREAN SECTION     CHOLECYSTECTOMY N/A 10/07/2016   Procedure: LAPAROSCOPIC CHOLECYSTECTOMY WITH INTRAOPERATIVE CHOLANGIOGRAM;  Surgeon: Harriette Bouillon, MD;  Location: [facility] OR;  Service: General;  Laterality: N/A;   CYSTITIS     CYSTOSCOPY N/A 03/30/2018   Procedure: Bluford Kaufmann;  Surgeon: Maxie Better, MD;  Location: [facility] ORS;  Service: Gynecology;  Laterality: N/A;   FOOT SURGERY     ROBOTIC ASSISTED LAPAROSCOPIC HYSTERECTOMY AND SALPINGECTOMY Bilateral 03/30/2018   Procedure: XI ROBOTIC ASSISTED LAPAROSCOPIC HYSTERECTOMY AND SALPINGECTOMY,;  Surgeon: Maxie Better, MD;  Location: [facility] ORS;  Service: Gynecology;  Laterality: Bilateral;  Overnight bed at [facility]   Patient Active Problem List   Diagnosis Date Noted   Prediabetes 01/10/2023   Posterior knee pain, left 01/10/2023   Class 1 obesity due to excess calories with body mass index (BMI) of 34.0 to 34.9 in adult 01/10/2023   Chronic insomnia 01/10/2023   Plantar fasciitis, bilateral 06/18/2018   Asthma 06/15/2018   Seasonal allergies 06/15/2018   Status post total hysterectomy 03/30/2018    PCP: Dorothyann Peng, MD  REFERRING PROVIDER: Crist Fat, MD   REFERRING DIAG:  R10.2 (ICD-10-CM) - Pelvic pain syndrome  Z87.442 (ICD-10-CM) - Personal history of urinary calculi    THERAPY DIAG:  No diagnosis found.  Rationale for Evaluation and Treatment: Rehabilitation  ONSET DATE: ***  SUBJECTIVE:                                                                                                                                                                                            SUBJECTIVE STATEMENT: *** Fluid intake: {Yes/No:304960894}   PAIN:  Are you having pain? {yes/no:20286} NPRS scale: ***/10 Pain location: {pelvic pain location:27098}  Pain type: {type:313116} Pain description: {PAIN DESCRIPTION:21022940}   Aggravating factors: *** Relieving factors: ***  PRECAUTIONS: None  WEIGHT BEARING RESTRICTIONS: No  FALLS:  Has patient fallen in last 6 months? {fallsyesno:27318}  LIVING ENVIRONMENT: Lives with: {[facility] lives with:25569::"lives with their family"} Lives in: {Lives in:25570} Stairs: {opstairs:27293} Has following equipment at home: {Assistive devices:23999}  OCCUPATION: ***  PLOF: {PLOF:24004}  PATIENT GOALS: ***  PERTINENT HISTORY:  Interstitial Cystitis; Cesarean section; Cholecystomy; Robotic assisted laparoscopic hysterectomy and salpingectomy Sexual abuse: {Yes/No:304960894}  BOWEL MOVEMENT: Pain with bowel movement: {yes/no:20286} Type of bowel movement:{PT BM type:27100} Fully empty rectum: {Yes/No:304960894} Leakage: {Yes/No:304960894} Pads: {Yes/No:304960894} Fiber supplement: {Yes/No:304960894}  URINATION: Pain with urination: {yes/no:20286} Fully empty bladder: {Yes/No:304960894} Stream: {PT urination:27102} Urgency: {Yes/No:304960894} Frequency: *** Leakage: {PT leakage:27103} Pads: {Yes/No:304960894}  INTERCOURSE: Pain with intercourse: {pain with intercourse PA:27099} Ability to have vaginal penetration:  {Yes/No:304960894} Climax: *** Marinoff Scale: ***/3  PREGNANCY: Vaginal deliveries *** Tearing {Yes***/No:304960894} C-section deliveries *** Currently pregnant {Yes***/No:304960894}  PROLAPSE: {PT prolapse:27101}   OBJECTIVE:   DIAGNOSTIC FINDINGS:  ***  PATIENT SURVEYS:  {rehab surveys:24030}  PFIQ-7 ***  COGNITION: Overall cognitive status:  {cognition:24006}     SENSATION: Light touch: {intact/deficits:24005} Proprioception: {intact/deficits:24005}  MUSCLE LENGTH: Hamstrings: Right *** deg; Left *** deg Thomas test: Right *** deg; Left *** deg  LUMBAR SPECIAL TESTS:  {lumbar special test:25242}  FUNCTIONAL TESTS:  {Functional tests:24029}  GAIT: Distance walked: *** Assistive device utilized: {Assistive devices:23999} Level of assistance: {Levels of assistance:24026} Comments: ***  POSTURE: {posture:25561}  PELVIC ALIGNMENT:  LUMBARAROM/PROM:  A/PROM A/PROM  eval  Flexion   Extension   Right lateral flexion   Left lateral flexion   Right rotation   Left rotation    (Blank rows = not tested)  LOWER EXTREMITY ROM:  {AROM/PROM:27142} ROM Right eval Left eval  Hip flexion    Hip extension    Hip abduction    Hip adduction    Hip internal rotation    Hip external rotation    Knee flexion    Knee extension    Ankle dorsiflexion    Ankle plantarflexion    Ankle inversion    Ankle eversion     (Blank rows = not tested)  LOWER EXTREMITY MMT:  MMT Right eval Left eval  Hip flexion    Hip extension    Hip abduction    Hip adduction    Hip internal rotation    Hip external rotation    Knee flexion    Knee extension    Ankle dorsiflexion    Ankle plantarflexion    Ankle inversion    Ankle eversion     PALPATION:   General  ***                External Perineal Exam ***                             Internal Pelvic Floor ***  Patient confirms identification and approves PT to assess internal pelvic floor and treatment {yes/no:20286}  PELVIC MMT:   MMT eval  Vaginal   Internal Anal Sphincter   External Anal Sphincter   Puborectalis   Diastasis Recti   (Blank rows = not tested)        TONE: ***  PROLAPSE: ***  TODAY'S TREATMENT:  DATE: ***  EVAL  ***   PATIENT EDUCATION:  Education details: *** Person educated: {Person educated:25204} Education method: {Education Method:25205} Education comprehension: {Education Comprehension:25206}  HOME EXERCISE PROGRAM: ***  ASSESSMENT:  CLINICAL IMPRESSION: Patient is a *** y.o. *** who was seen today for physical therapy evaluation and treatment for ***.   OBJECTIVE IMPAIRMENTS: {opptimpairments:25111}.   ACTIVITY LIMITATIONS: {activitylimitations:27494}  PARTICIPATION LIMITATIONS: {participationrestrictions:25113}  PERSONAL FACTORS: {Personal factors:25162} are also affecting patient's functional outcome.   REHAB POTENTIAL: {rehabpotential:25112}  CLINICAL DECISION MAKING: {clinical decision making:25114}  EVALUATION COMPLEXITY: {Evaluation complexity:25115}   GOALS: Goals reviewed with patient? {yes/no:20286}  SHORT TERM GOALS: Target date: ***  *** Baseline: Goal status: INITIAL  2.  *** Baseline:  Goal status: INITIAL  3.  *** Baseline:  Goal status: INITIAL  4.  *** Baseline:  Goal status: INITIAL  5.  *** Baseline:  Goal status: INITIAL  6.  *** Baseline:  Goal status: INITIAL  LONG TERM GOALS: Target date: ***  *** Baseline:  Goal status: INITIAL  2.  *** Baseline:  Goal status: INITIAL  3.  *** Baseline:  Goal status: INITIAL  4.  *** Baseline:  Goal status: INITIAL  5.  *** Baseline:  Goal status: INITIAL  6.  *** Baseline:  Goal status: INITIAL  PLAN:  PT FREQUENCY: {rehab frequency:25116}  PT DURATION: {rehab duration:25117}  PLANNED INTERVENTIONS: {rehab planned interventions:25118::"Therapeutic exercises","Therapeutic activity","Neuromuscular re-education","Balance training","Gait training","Patient/Family education","Self Care","Joint mobilization"}  PLAN FOR NEXT SESSION: ***   Danaija Eskridge, PT 01/18/2023, 9:27 AM

## 2023-01-19 ENCOUNTER — Other Ambulatory Visit: Payer: Self-pay

## 2023-01-19 ENCOUNTER — Encounter: Payer: Self-pay | Admitting: Physical Therapy

## 2023-01-19 ENCOUNTER — Ambulatory Visit: Payer: BC Managed Care – PPO | Attending: Urology | Admitting: Physical Therapy

## 2023-01-19 DIAGNOSIS — R278 Other lack of coordination: Secondary | ICD-10-CM | POA: Diagnosis not present

## 2023-01-19 DIAGNOSIS — M5459 Other low back pain: Secondary | ICD-10-CM | POA: Diagnosis not present

## 2023-01-25 ENCOUNTER — Encounter: Payer: Self-pay | Admitting: Physical Therapy

## 2023-01-25 ENCOUNTER — Encounter: Payer: BC Managed Care – PPO | Attending: Urology | Admitting: Physical Therapy

## 2023-01-25 DIAGNOSIS — M5459 Other low back pain: Secondary | ICD-10-CM | POA: Insufficient documentation

## 2023-01-25 DIAGNOSIS — R278 Other lack of coordination: Secondary | ICD-10-CM | POA: Diagnosis not present
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
## 2023-02-05 ENCOUNTER — Other Ambulatory Visit: Payer: Self-pay | Admitting: Internal Medicine

## 2023-02-09 ENCOUNTER — Other Ambulatory Visit: Payer: Self-pay | Admitting: Internal Medicine

## 2023-02-15 ENCOUNTER — Ambulatory Visit (INDEPENDENT_AMBULATORY_CARE_PROVIDER_SITE_OTHER): Payer: BC Managed Care – PPO | Admitting: Internal Medicine

## 2023-02-15 ENCOUNTER — Encounter: Payer: Self-pay | Admitting: Internal Medicine

## 2023-02-15 VITALS — BP 108/74 | HR 86 | Temp 97.7°F | Ht 61.0 in | Wt 186.0 lb

## 2023-02-15 DIAGNOSIS — M25561 Pain in right knee: Secondary | ICD-10-CM

## 2023-02-15 DIAGNOSIS — Z6835 Body mass index (BMI) 35.0-35.9, adult: Secondary | ICD-10-CM

## 2023-02-15 DIAGNOSIS — M25562 Pain in left knee: Secondary | ICD-10-CM

## 2023-02-15 DIAGNOSIS — G8929 Other chronic pain: Secondary | ICD-10-CM | POA: Diagnosis not present

## 2023-02-15 DIAGNOSIS — M79605 Pain in left leg: Secondary | ICD-10-CM

## 2023-02-15 NOTE — Patient Instructions (Signed)
Chronic Knee Pain, Adult Knee pain that lasts longer than 3 months is called chronic knee pain. You may have pain in one or both knees. Symptoms of chronic knee pain may also include swelling and stiffness. The most common cause is age-related wear and tear (osteoarthritis) of your knee joint. Many conditions can cause chronic knee pain. Treatment depends on the cause. The main treatments are physical therapy and weight loss. It may also be treated with medicines, injections, a knee sleeve or brace, and by using crutches. Rest, ice, pressure (compression), and elevation, also known as RICE therapy, may also be recommended. Follow these instructions at home: If you have a knee sleeve or brace:  Wear the knee sleeve or brace as told by your doctor. Take it off only as told by your doctor. Loosen it if your toes: Tingle. Become numb. Turn cold and blue. Keep it clean. If the sleeve or brace is not waterproof: Do not let it get wet. Ask your doctor if you may take it off when you take a bath or shower. If not, cover it with a watertight covering. Managing pain, stiffness, and swelling     If told, put heat on your knee. Do this as often as told by your doctor. Use the heat source that your doctor recommends, such as a moist heat pack or a heating pad. If you have a removable knee sleeve or brace, take it off as told by your doctor. Place a towel between your skin and the heat source. Leave the heat on for 20-30 minutes. Take off the heat if your skin turns bright red. This is very important. If you cannot feel pain, heat, or cold, you have a greater risk of getting burned. If told, put ice on your knee. To do this: If you have a removable knee sleeve or brace, take it off as told by your doctor. Put ice in a plastic bag. Place a towel between your skin and the bag. Leave the ice on for 20 minutes, 2-3 times a day. Take off the ice if your skin turns bright red. This is very important. If you  cannot feel pain, heat, or cold, you have a greater risk of damage to the area. Move your toes often. Raise the injured area above the level of your heart while you are sitting or lying down. Activity Avoid activities where both feet leave the ground at the same time (high-impact activities). Examples are running, jumping rope, and doing jumping jacks. Follow the exercise plan that your doctor makes for you. Your doctor may suggest that you: Avoid activities that make knee pain worse. You may need to change the exercises that you do, the sports that you participate in, or your job duties. Wear shoes with cushioned soles. Avoid sports that require running and sudden changes in direction. Do exercises or physical therapy. This is planned to match your needs and your abilities. Do exercises that increase your balance and strength, such as tai chi and yoga. Do not use your injured knee to support your body weight until your doctor says that you can. Use crutches as told by your doctor. Return to your normal activities when your doctor says that it is safe. General instructions Take over-the-counter and prescription medicines only as told by your doctor. If you are overweight, work with your doctor and a food expert (dietitian) to set goals to lose weight. Being overweight can make your knee hurt more. Do not smoke or use any  products that contain nicotine or tobacco. If you need help quitting, ask your doctor. Keep all follow-up visits. Contact a doctor if: You have knee pain that is not getting better or gets worse. You are not able to do your exercises due to knee pain. Get help right away if: Your knee swells and the swelling gets worse. You cannot move your knee. You have very bad knee pain. Summary Knee pain that lasts more than 3 months is called chronic knee pain. The main treatments for chronic knee pain are physical therapy and weight loss. You may also need to take medicines, wear a  knee sleeve or brace, use crutches, and put ice or heat on your knee. Lose weight if you are overweight. Work with your doctor and a food expert (dietitian) to help you set goals to lose weight. Being overweight can make your knee hurt more. Follow the exercise plan that your doctor makes for you. This information is not intended to replace advice given to you by your health care provider. Make sure you discuss any questions you have with your health care provider. Document Revised: 12/11/2019 Document Reviewed: 12/12/2019 Elsevier Patient Education  2024 [employer].

## 2023-02-15 NOTE — Progress Notes (Signed)
I,Samantha Mcgee, CMA,acting as a Neurosurgeon for Samantha Aliment, MD.,have documented all relevant documentation on the behalf of Samantha Aliment, MD,as directed by  Samantha Aliment, MD while in the presence of Samantha Aliment, MD.  Subjective:  Patient ID: Samantha Mcgee , female    DOB: April 07, 1966 , 57 y.o.   MRN: [id number]  Chief Complaint  Patient presents with   Left Leg Pain     HPI  Patient presents today for recurrent left leg pain. She reports this initially started last weekend. She reports the pain radiates from the top of the back of her left leg all the way down. Described as a dull, throbbing pain. Denies recent fall/trauma.   She has tried meloxicam with minimal relief of her symptoms.       Past Medical History:  Diagnosis Date   Anxiety    Arthritis    Asthma    Headache    History of kidney stones    Interstitial cystitis    Seasonal allergies      Family History  Problem Relation Age of Onset   Diabetes Mother    Stroke Mother    Hyperlipidemia Mother    Arthritis Father    Heart attack Father    Hyperlipidemia Father    Eczema Brother    Cancer Brother    Eczema Daughter    Allergic rhinitis Neg Hx    Angioedema Neg Hx    Asthma Neg Hx    Immunodeficiency Neg Hx    Urticaria Neg Hx      Current Outpatient Medications:    albuterol (PROAIR HFA) 108 (90 Base) MCG/ACT inhaler, Use 2 puffs every 4 hours as needed for cough or wheeze.  May use 2 puffs 10-20 minutes prior to exercise., Disp: 1 Inhaler, Rfl: 1   amitriptyline (ELAVIL) 10 MG tablet, TAKE 3 TABLETS BY MOUTH AT BEDTIME AS NEEDED FOR SLEEP, Disp: 270 tablet, Rfl: 2   CALCIUM PO, Take by mouth., Disp: , Rfl:    cetirizine (ZYRTEC) 10 MG tablet, TAKE 1 TABLET BY MOUTH EVERY DAY FOR RUNNY NOSE OR ITCHING (Patient taking differently: Take 10 mg by mouth daily.), Disp: 90 tablet, Rfl: 1   Clindamycin-Benzoyl Per, Refr, gel, Apply 1 application topically daily., Disp: , Rfl: 3   meloxicam  (MOBIC) 7.5 MG tablet, TAKE 1 TABLET (7.5 MG TOTAL) BY MOUTH DAILY AS NEEDED, Disp: 30 tablet, Rfl: 1   mirabegron ER (MYRBETRIQ) 50 MG TB24 tablet, Take 50 mg by mouth daily., Disp: , Rfl:    montelukast (SINGULAIR) 10 MG tablet, Take 1 tablet (10 mg total) by mouth at bedtime., Disp: 30 tablet, Rfl: 5   Omega-3 1000 MG CAPS, Take 1,000 mg by mouth daily., Disp: , Rfl:    omeprazole (PRILOSEC) 40 MG capsule, TAKE 1 CAPSULE BY MOUTH TWICE A DAY 30 MINUTES BEFORE MORNING AND EVENING MEALS, Disp: 180 capsule, Rfl: 1   OVER THE COUNTER MEDICATION, Vit D 1000 daily, Disp: , Rfl:    polyethylene glycol (MIRALAX / GLYCOLAX) 17 g packet, Take 17 g by mouth daily. daily, Disp: , Rfl:    Probiotic Product (PROBIOTIC PO), Take 1 capsule by mouth 2 (two) times daily. ULTRA FLORA, Disp: , Rfl:    RETIN-A MICRO PUMP 0.08 % GEL, Apply 1 application topically at bedtime., Disp: , Rfl: 1   vitamin B-12 (CYANOCOBALAMIN) 500 MCG tablet, Take 500 mcg by mouth daily. , Disp: , RflMonte Fantasia  INHUB 250-50 MCG/ACT AEPB, Inhale 1 puff into the lungs 2 (two) times daily., Disp: , Rfl:    zolpidem (AMBIEN) 5 MG tablet, TAKE 1 TABLET BY MOUTH EVERY DAY AT BEDTIME AS NEEDED, Disp: 30 tablet, Rfl: 0   Allergies  Allergen Reactions   Latex Other (See Comments)    Blisters.     Review of Systems  Constitutional: Negative.   Respiratory: Negative.    Cardiovascular: Negative.   Gastrointestinal: Negative.   Musculoskeletal:  Positive for arthralgias.       Also w/ b/l knee pain  Neurological: Negative.   Psychiatric/Behavioral: Negative.       Today's Vitals   02/15/23 1035  BP: 108/74  Pulse: 86  Temp: 97.7 F (36.5 C)  SpO2: 98%  Weight: 186 lb (84.4 kg)  Height: 5\' 1"  (1.549 m)   Body mass index is 35.14 kg/m.  Wt Readings from Last 3 Encounters:  02/15/23 186 lb (84.4 kg)  01/10/23 184 lb 9.6 oz (83.7 kg)  08/03/22 188 lb (85.3 kg)     Objective:  Physical Exam Vitals and nursing note  reviewed.  Constitutional:      Appearance: Normal appearance.  HENT:     Head: Normocephalic and atraumatic.  Eyes:     Extraocular Movements: Extraocular movements intact.  Cardiovascular:     Rate and Rhythm: Normal rate and regular rhythm.     Heart sounds: Normal heart sounds.  Pulmonary:     Effort: Pulmonary effort is normal.     Breath sounds: Normal breath sounds.  Musculoskeletal:        General: Tenderness present.     Cervical back: Normal range of motion.     Comments: Posterior left knee tenderness to palpation  Skin:    General: Skin is warm.  Neurological:     General: No focal deficit present.     Mental Status: She is alert.  Psychiatric:        Mood and Affect: Mood normal.        Behavior: Behavior normal.         Assessment And Plan:  Left leg pain Assessment & Plan: Sx suggestive of Baker's cyst. Agrees to Ortho evaluation.   Orders: -     Ambulatory referral to Sports Medicine  Chronic pain of both knees Assessment & Plan: She may have OA. Agrees to Ortho referral for further evaluation and radiographic studies.   Orders: -     Ambulatory referral to Sports Medicine  Class 2 severe obesity due to excess calories with serious comorbidity and body mass index (BMI) of 35.0 to 35.9 in adult [facility]) Assessment & Plan: She is encouraged to strive for BMI less than 30 to decrease cardiac risk. Advised to aim for at least 150 minutes of exercise per week.    She is encouraged to strive for BMI less than 30 to decrease cardiac risk. Advised to aim for at least 150 minutes of exercise per week.    Return if symptoms worsen or fail to improve.  Patient was given opportunity to ask questions. Patient verbalized understanding of the plan and was able to repeat key elements of the plan. All questions were answered to their satisfaction.    I, Samantha Aliment, MD, have reviewed all documentation for this visit. The documentation on 02/15/23 for the exam,  diagnosis, procedures, and orders are all accurate and complete.   IF YOU HAVE BEEN REFERRED TO A SPECIALIST, IT MAY TAKE 1-2 WEEKS  TO SCHEDULE/PROCESS THE REFERRAL. IF YOU HAVE NOT HEARD FROM US/SPECIALIST IN TWO WEEKS, PLEASE GIVE [place] A CALL AT [phone].   THE PATIENT IS ENCOURAGED TO PRACTICE SOCIAL DISTANCING DUE TO THE COVID-19 PANDEMIC.

## 2023-02-16 DIAGNOSIS — E66812 Obesity, class 2: Secondary | ICD-10-CM | POA: Insufficient documentation

## 2023-02-16 DIAGNOSIS — M79605 Pain in left leg: Secondary | ICD-10-CM | POA: Insufficient documentation

## 2023-02-16 NOTE — Assessment & Plan Note (Signed)
She is encouraged to strive for BMI less than 30 to decrease cardiac risk. Advised to aim for at least 150 minutes of exercise per week.  

## 2023-02-16 NOTE — Assessment & Plan Note (Signed)
Sx suggestive of Baker's cyst. Agrees to Ortho evaluation.

## 2023-02-16 NOTE — Assessment & Plan Note (Addendum)
She may have OA. Agrees to Ortho referral for further evaluation and radiographic studies.

## 2023-02-18 DIAGNOSIS — M25562 Pain in left knee: Secondary | ICD-10-CM | POA: Diagnosis not present

## 2023-03-15 ENCOUNTER — Encounter: Payer: BC Managed Care – PPO | Attending: Urology | Admitting: Physical Therapy

## 2023-03-15 ENCOUNTER — Encounter: Payer: Self-pay | Admitting: Physical Therapy

## 2023-03-15 DIAGNOSIS — R278 Other lack of coordination: Secondary | ICD-10-CM | POA: Insufficient documentation

## 2023-03-15 DIAGNOSIS — R102 Pelvic and perineal pain: Secondary | ICD-10-CM | POA: Diagnosis not present

## 2023-03-15 DIAGNOSIS — Z87442 Personal history of urinary calculi: Secondary | ICD-10-CM | POA: Insufficient documentation

## 2023-03-15 DIAGNOSIS — M5459 Other low back pain: Secondary | ICD-10-CM | POA: Insufficient documentation

## 2023-03-15 NOTE — Patient Instructions (Signed)
Moisturizers They are used in the vagina to hydrate the mucous membrane that make up the vaginal canal. Designed to keep a more normal acid balance (ph) Once placed in the vagina, it will last between two to three days.  Use 2-3 times per week at bedtime  Ingredients to avoid is glycerin and fragrance, can increase chance of infection Should not be used just before sex due to causing irritation Most are gels administered either in a tampon-shaped applicator or as a vaginal suppository. They are non-hormonal.   Types of Moisturizers(internal use)  Vitamin E vaginal suppositories- Whole foods, Amazon Moist Again Coconut oil- can break down condoms, any grocery store (prefer organic) Julva- (Do no use if taking  Tamoxifen) amazon Yes moisturizer- amazon NeuEve Silk , NeuEve Silver for menopausal or over 65 (if have severe vaginal atrophy or cancer treatments use NeuEve Silk for  1 month than move to [employer])- [employer], [place].com Olive and Bee intimate cream- www.oliveandbee.com.au Mae vaginal moisturizer- Amazon Aloe Good Clean Love Hyaluronic acid Hyalofemme Reveree hyaluronic acid inserts   Creams to use externally on the Vulva area Marathon Oil (good for for cancer patients that had radiation to the area)- [place] or [employer].https://garcia-[email or web address]/ Vulva Balm/ V-magic cream by medicine mama- amazon Julva-amazon Vital "V Wild Yam salve ( help moisturize and help with thinning vulvar area, does have Beeswax MoodMaid Botanical Pro-Meno Wild Yam Cream- Amazon Desert Harvest Gele Cleo by Zane Herald labial moisturizer (Amazon),  Coconut or olive oil aloe Good Clean Love Enchanted Rose by intimate rose  Things to avoid in the vaginal area Do not use things to irritate the vulvar area No lotions just specialized creams for the vulva area- Neogyn, V-magic,  No soaps; can use Aveeno or Calendula cleanser, unscented Dove if needed. Must be gentle No deodorants No douches Good to  sleep without underwear to let the vaginal area to air out No scrubbing: spread the lips to let warm water rinse over labias and pat dry   Eulis Foster, PT [facility] [address] [place], [place] [address] W: [phone] [email or web address]
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
## 2023-03-22 ENCOUNTER — Encounter: Payer: BC Managed Care – PPO | Admitting: Physical Therapy

## 2023-03-28 ENCOUNTER — Telehealth: Payer: Self-pay | Admitting: Physical Therapy

## 2023-03-28 ENCOUNTER — Ambulatory Visit: Payer: BC Managed Care – PPO | Attending: Urology | Admitting: Physical Therapy

## 2023-03-28 DIAGNOSIS — M5459 Other low back pain: Secondary | ICD-10-CM | POA: Insufficient documentation

## 2023-03-28 DIAGNOSIS — R278 Other lack of coordination: Secondary | ICD-10-CM | POA: Insufficient documentation

## 2023-03-28 DIAGNOSIS — R350 Frequency of micturition: Secondary | ICD-10-CM | POA: Diagnosis not present

## 2023-03-28 DIAGNOSIS — R102 Pelvic and perineal pain: Secondary | ICD-10-CM | POA: Diagnosis not present

## 2023-03-28 NOTE — Telephone Encounter (Signed)
Called patient about her missed appointment today at 8:00. She is dealing with her mom passing away. Let her know about her next appointment.  Eulis Foster, PT @9 /16/24@ 8:22 AM

## 2023-04-04 ENCOUNTER — Encounter: Payer: Self-pay | Admitting: Physical Therapy

## 2023-04-04 ENCOUNTER — Ambulatory Visit: Payer: BC Managed Care – PPO | Admitting: Physical Therapy

## 2023-04-04 DIAGNOSIS — M5459 Other low back pain: Secondary | ICD-10-CM | POA: Diagnosis not present

## 2023-04-04 DIAGNOSIS — R278 Other lack of coordination: Secondary | ICD-10-CM
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
## 2023-04-11 ENCOUNTER — Ambulatory Visit: Payer: BC Managed Care – PPO | Admitting: Physical Therapy

## 2023-04-11 ENCOUNTER — Encounter: Payer: Self-pay | Admitting: Physical Therapy

## 2023-04-11 DIAGNOSIS — M5459 Other low back pain: Secondary | ICD-10-CM | POA: Diagnosis not present

## 2023-04-11 DIAGNOSIS — R278 Other lack of coordination: Secondary | ICD-10-CM | POA: Diagnosis not present
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
## 2023-04-11 NOTE — Patient Instructions (Signed)
Urge Incontinence  Ideal urination frequency is every 2-4 wakeful hours, which equates to 5-8 times within a 24-hour period.   Urge incontinence is leakage that occurs when the bladder muscle contracts, creating a sudden need to go before getting to the bathroom.   Going too often when your bladder isn't actually full can disrupt the body's automatic signals to store and hold urine longer, which will increase urgency/frequency.  In this case, the bladder "is running the show" and strategies can be learned to retrain this pattern.   One should be able to control the first urge to urinate, at around .  The bladder can hold up to a "grande latte," or . To help you gain control, practice the Urge Drill below when urgency strikes.  This drill will help retrain your bladder signals and allow you to store and hold urine longer.  The overall goal is to stretch out your time between voids to reach a more manageable voiding schedule.    Practice your "quick flicks" often throughout the day (each waking hour) even when you don't need feel the urge to go.  This will help strengthen your pelvic floor muscles, making them more effective in controlling leakage.  Urge Drill  When you feel an urge to go, follow these steps to regain control: Stop what you are doing and be still Take one deep breath, directing your air into your abdomen Think an affirming thought, such as "I've got this." Do 5 quick flicks of your pelvic floor Bring heels up and down in sitting or standing  Walk with control to the bathroom to void, or delay voiding If you get the urge again then repeat as above.   [facility] [address] [place], [place] [address] Phone # [phone] Fax [phone]

## 2023-05-02 ENCOUNTER — Other Ambulatory Visit: Payer: Self-pay | Admitting: Internal Medicine

## 2023-05-12 ENCOUNTER — Other Ambulatory Visit: Payer: Self-pay | Admitting: Internal Medicine

## 2023-05-18 ENCOUNTER — Encounter: Payer: Self-pay | Admitting: Physical Therapy

## 2023-05-18 ENCOUNTER — Ambulatory Visit: Payer: BC Managed Care – PPO | Attending: Urology | Admitting: Physical Therapy

## 2023-05-18 DIAGNOSIS — R278 Other lack of coordination: Secondary | ICD-10-CM | POA: Diagnosis not present

## 2023-05-18 DIAGNOSIS — M5459 Other low back pain: Secondary | ICD-10-CM | POA: Insufficient documentation
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
## 2023-05-19 DIAGNOSIS — M79675 Pain in left toe(s): Secondary | ICD-10-CM | POA: Diagnosis not present

## 2023-05-25 ENCOUNTER — Encounter: Payer: Self-pay | Admitting: Physical Therapy

## 2023-05-25 ENCOUNTER — Ambulatory Visit: Payer: BC Managed Care – PPO | Admitting: Physical Therapy

## 2023-05-25 DIAGNOSIS — M5459 Other low back pain: Secondary | ICD-10-CM | POA: Diagnosis not present

## 2023-05-25 DIAGNOSIS — R278 Other lack of coordination: Secondary | ICD-10-CM
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
## 2023-06-20 ENCOUNTER — Encounter: Payer: Self-pay | Admitting: Physical Therapy

## 2023-06-20 ENCOUNTER — Ambulatory Visit: Payer: BC Managed Care – PPO | Attending: Urology | Admitting: Physical Therapy

## 2023-06-20 DIAGNOSIS — M5459 Other low back pain: Secondary | ICD-10-CM | POA: Insufficient documentation

## 2023-06-20 DIAGNOSIS — R278 Other lack of coordination: Secondary | ICD-10-CM | POA: Diagnosis not present
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
## 2023-06-27 ENCOUNTER — Other Ambulatory Visit: Payer: Self-pay | Admitting: Internal Medicine

## 2023-06-27 ENCOUNTER — Ambulatory Visit
Admission: RE | Admit: 2023-06-27 | Discharge: 2023-06-27 | Disposition: A | Discharge status: Home / Self Care | Payer: BC Managed Care – PPO | Source: Ambulatory Visit | Attending: Internal Medicine | Admitting: Internal Medicine

## 2023-06-27 DIAGNOSIS — Z1231 Encounter for screening mammogram for malignant neoplasm of breast: Secondary | ICD-10-CM | POA: Diagnosis not present

## 2023-06-29 ENCOUNTER — Ambulatory Visit: Payer: BC Managed Care – PPO | Admitting: Physical Therapy

## 2023-06-29 ENCOUNTER — Encounter: Payer: Self-pay | Admitting: Physical Therapy

## 2023-06-29 DIAGNOSIS — R278 Other lack of coordination: Secondary | ICD-10-CM

## 2023-06-29 DIAGNOSIS — M5459 Other low back pain: Secondary | ICD-10-CM
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
## 2023-07-19 ENCOUNTER — Other Ambulatory Visit: Payer: Self-pay | Admitting: Internal Medicine

## 2023-08-11 ENCOUNTER — Encounter: Payer: Self-pay | Admitting: Internal Medicine

## 2023-08-11 ENCOUNTER — Ambulatory Visit (INDEPENDENT_AMBULATORY_CARE_PROVIDER_SITE_OTHER): Payer: BC Managed Care – PPO | Admitting: Internal Medicine

## 2023-08-11 VITALS — BP 108/72 | HR 74 | Temp 98.5°F | Ht 61.0 in | Wt 190.4 lb

## 2023-08-11 DIAGNOSIS — Z6835 Body mass index (BMI) 35.0-35.9, adult: Secondary | ICD-10-CM

## 2023-08-11 DIAGNOSIS — E66812 Obesity, class 2: Secondary | ICD-10-CM | POA: Diagnosis not present

## 2023-08-11 DIAGNOSIS — Z0000 Encounter for general adult medical examination without abnormal findings: Secondary | ICD-10-CM | POA: Diagnosis not present

## 2023-08-11 DIAGNOSIS — E559 Vitamin D deficiency, unspecified: Secondary | ICD-10-CM | POA: Diagnosis not present

## 2023-08-11 DIAGNOSIS — R7303 Prediabetes: Secondary | ICD-10-CM

## 2023-08-11 NOTE — Progress Notes (Signed)
I,[name], CMA,acting as a Neurosurgeon for Samantha Aliment, MD.,have documented all relevant documentation on the behalf of Samantha Aliment, MD,as directed by  Samantha Aliment, MD while in the presence of Samantha Aliment, MD.  Subjective:    Patient ID: Samantha Mcgee , female    DOB: 1965-12-07 , 58 y.o.   MRN: [id number]  Chief Complaint  Patient presents with   Annual Exam    HPI  She is here today for a full physical examination.  She is followed by Dr. Cherly Hensen for her GYN exams. She has an upcoming appt in March. She is excited to report she has moved into a new home. Her daughter is doing well in college.   She has no specific concerns or complaints at this time.      Past Medical History:  Diagnosis Date   Anxiety    Arthritis    Asthma    Headache    History of kidney stones    Interstitial cystitis    Seasonal allergies      Family History  Problem Relation Age of Onset   Diabetes Mother    Stroke Mother    Hyperlipidemia Mother    Arthritis Father    Heart attack Father    Hyperlipidemia Father    Eczema Brother    Cancer Brother    Eczema Daughter    Allergic rhinitis Neg Hx    Angioedema Neg Hx    Asthma Neg Hx    Immunodeficiency Neg Hx    Urticaria Neg Hx      Current Outpatient Medications:    albuterol (PROAIR HFA) 108 (90 Base) MCG/ACT inhaler, Use 2 puffs every 4 hours as needed for cough or wheeze.  May use 2 puffs 10-20 minutes prior to exercise., Disp: 1 Inhaler, Rfl: 1   amitriptyline (ELAVIL) 10 MG tablet, TAKE 3 TABLETS BY MOUTH AT BEDTIME AS NEEDED FOR SLEEP, Disp: 270 tablet, Rfl: 2   CALCIUM PO, Take by mouth., Disp: , Rfl:    cetirizine (ZYRTEC) 10 MG tablet, TAKE 1 TABLET BY MOUTH EVERY DAY FOR RUNNY NOSE OR ITCHING (Patient taking differently: Take 10 mg by mouth daily.), Disp: 90 tablet, Rfl: 1   Clindamycin-Benzoyl Per, Refr, gel, Apply 1 application topically daily., Disp: , Rfl: 3   mirabegron ER (MYRBETRIQ) 50 MG TB24  tablet, Take 50 mg by mouth daily., Disp: , Rfl:    montelukast (SINGULAIR) 10 MG tablet, Take 1 tablet (10 mg total) by mouth at bedtime., Disp: 30 tablet, Rfl: 5   Omega-3 1000 MG CAPS, Take 1,000 mg by mouth daily., Disp: , Rfl:    omeprazole (PRILOSEC) 40 MG capsule, TAKE 1 CAPSULE BY MOUTH TWICE A DAY 30 MINUTES BEFORE MORNING AND EVENING MEALS, Disp: 180 capsule, Rfl: 1   OVER THE COUNTER MEDICATION, Vit D 1000 daily, Disp: , Rfl:    polyethylene glycol (MIRALAX / GLYCOLAX) 17 g packet, Take 17 g by mouth daily. daily, Disp: , Rfl:    Probiotic Product (PROBIOTIC PO), Take 1 capsule by mouth 2 (two) times daily. ULTRA FLORA, Disp: , Rfl:    RETIN-A MICRO PUMP 0.08 % GEL, Apply 1 application topically at bedtime., Disp: , Rfl: 1   vitamin B-12 (CYANOCOBALAMIN) 500 MCG tablet, Take 500 mcg by mouth daily. , Disp: , Rfl:    WIXELA INHUB 250-50 MCG/ACT AEPB, Inhale 1 puff into the lungs 2 (two) times daily., Disp: , Rfl:    zolpidem (  AMBIEN) 5 MG tablet, TAKE 1 TABLET BY MOUTH EVERY DAY AT BEDTIME AS NEEDED, Disp: 30 tablet, Rfl: 1   meloxicam (MOBIC) 7.5 MG tablet, TAKE 1 TABLET BY MOUTH DAILY AS NEEDED. (Patient not taking: Reported on 08/11/2023), Disp: 30 tablet, Rfl: 1   Allergies  Allergen Reactions   Latex Other (See Comments)    Blisters.      The patient states she uses none for birth control. Patient's last menstrual period was 03/06/2018.. Negative for Dysmenorrhea. Negative for: breast discharge, breast lump(s), breast pain and breast self exam. Associated symptoms include abnormal vaginal bleeding. Pertinent negatives include abnormal bleeding (hematology), anxiety, decreased libido, depression, difficulty falling sleep, dyspareunia, history of infertility, nocturia, sexual dysfunction, sleep disturbances, urinary incontinence, urinary urgency, vaginal discharge and vaginal itching. Diet regular.The patient states her exercise level is  intermittent.  . The patient's tobacco use  is:  Social History   Tobacco Use  Smoking Status Never  Smokeless Tobacco Never  . She has been exposed to passive smoke. The patient's alcohol use is:  Social History   Substance and Sexual Activity  Alcohol Use Yes   Alcohol/week: 1.0 standard drink of alcohol   Types: 1 Glasses of wine per week   Comment: weekly    Review of Systems  Constitutional: Negative.   HENT: Negative.    Eyes: Negative.   Respiratory: Negative.    Cardiovascular: Negative.   Gastrointestinal: Negative.   Endocrine: Negative.   Genitourinary: Negative.   Musculoskeletal: Negative.   Skin: Negative.   Allergic/Immunologic: Negative.   Neurological: Negative.   Hematological: Negative.   Psychiatric/Behavioral: Negative.       Today's Vitals   08/11/23 0835  BP: 108/72  Pulse: 74  Temp: 98.5 F (36.9 C)  SpO2: 98%  Weight: 190 lb 6.4 oz (86.4 kg)  Height: 5\' 1"  (1.549 m)   Body mass index is 35.98 kg/m.  Wt Readings from Last 3 Encounters:  08/11/23 190 lb 6.4 oz (86.4 kg)  02/15/23 186 lb (84.4 kg)  01/10/23 184 lb 9.6 oz (83.7 kg)     Objective:  Physical Exam Vitals and nursing note reviewed.  Constitutional:      Appearance: Normal appearance.  HENT:     Head: Normocephalic and atraumatic.     Right Ear: Tympanic membrane, ear canal and external ear normal.     Left Ear: Tympanic membrane, ear canal and external ear normal.     Nose: No rhinorrhea.     Comments: Masked     Mouth/Throat:     Pharynx: No oropharyngeal exudate or posterior oropharyngeal erythema.     Comments: Masked  Eyes:     Extraocular Movements: Extraocular movements intact.     Conjunctiva/sclera: Conjunctivae normal.     Pupils: Pupils are equal, round, and reactive to light.  Cardiovascular:     Rate and Rhythm: Normal rate and regular rhythm.     Pulses: Normal pulses.     Heart sounds: Normal heart sounds.  Pulmonary:     Effort: Pulmonary effort is normal.     Breath sounds: Normal  breath sounds.  Chest:  Breasts:    Tanner Score is 5.     Right: Normal.     Left: Normal.  Abdominal:     General: Bowel sounds are normal.     Palpations: Abdomen is soft.  Genitourinary:    Comments: deferred Musculoskeletal:        General: Normal range of motion.  Cervical back: Normal range of motion and neck supple.  Skin:    General: Skin is warm and dry.  Neurological:     General: No focal deficit present.     Mental Status: She is alert and oriented to person, place, and time.  Psychiatric:        Mood and Affect: Mood normal.        Behavior: Behavior normal.         Assessment And Plan:     Encounter for general adult medical examination w/o abnormal findings Assessment & Plan: A full exam was performed.  Importance of monthly self breast exams was discussed with the patient.  She is advised to get 30-45 minutes of regular exercise, no less than four to five days per week. Both weight-bearing and aerobic exercises are recommended.  She is advised to follow a healthy diet with at least six fruits/veggies per day, decrease intake of red meat and other saturated fats and to increase fish intake to twice weekly.  Meats/fish should not be fried -- baked, boiled or broiled is preferable. It is also important to cut back on your sugar intake.  Be sure to read labels - try to avoid anything with added sugar, high fructose corn syrup or other sweeteners.  If you must use a sweetener, you can try stevia or monkfruit.  It is also important to avoid artificially sweetened foods/beverages and diet drinks. Lastly, wear SPF 50 sunscreen on exposed skin and when in direct sunlight for an extended period of time.  Be sure to avoid fast food restaurants and aim for at least 60 ounces of water daily.      Orders: -     CBC -     CMP14+EGFR -     Lipid panel  Prediabetes Assessment & Plan: Previous labs reviewed, her A1c has been elevated in the past. I will check an A1c today.  Reminded to avoid refined sugars including sugary drinks/foods and processed meats including bacon, sausages and deli meats.    Orders: -     Hemoglobin A1c  Vitamin D deficiency disease Assessment & Plan: I will check a vitamin D level and supplement as needed.   Orders: -     VITAMIN D 25 Hydroxy (Vit-D Deficiency, Fractures)  Class 2 severe obesity due to excess calories with serious comorbidity and body mass index (BMI) of 35.0 to 35.9 in adult [facility]) Assessment & Plan: She is encouraged to strive for BMI less than 30 to decrease cardiac risk. Advised to aim for at least 150 minutes of exercise per week.    She is encouraged to strive for BMI less than 30 to decrease cardiac risk. Advised to aim for at least 150 minutes of exercise per week.   Return for 1 year physical. Patient was given opportunity to ask questions. Patient verbalized understanding of the plan and was able to repeat key elements of the plan. All questions were answered to their satisfaction.    I, Samantha Aliment, MD, have reviewed all documentation for this visit. The documentation on 08/11/23 for the exam, diagnosis, procedures, and orders are all accurate and complete.

## 2023-08-11 NOTE — Assessment & Plan Note (Signed)

## 2023-08-11 NOTE — Patient Instructions (Signed)

## 2023-08-11 NOTE — Assessment & Plan Note (Signed)
I will check a vitamin D level and supplement as needed.

## 2023-08-12 LAB — CMP14+EGFR
ALT: 12 [IU]/L (ref 0–32)
AST: 14 [IU]/L (ref 0–40)
Albumin: 4 g/dL (ref 3.8–4.9)
Alkaline Phosphatase: 95 [IU]/L (ref 44–121)
BUN/Creatinine Ratio: 10 (ref 9–23)
BUN: 8 mg/dL (ref 6–24)
Bilirubin Total: 0.3 mg/dL (ref 0.0–1.2)
CO2: 22 mmol/L (ref 20–29)
Calcium: 9.8 mg/dL (ref 8.7–10.2)
Chloride: 102 mmol/L (ref 96–106)
Creatinine, Ser: 0.82 mg/dL (ref 0.57–1.00)
Globulin, Total: 2.9 g/dL (ref 1.5–4.5)
Glucose: 83 mg/dL (ref 70–99)
Potassium: 4.5 mmol/L (ref 3.5–5.2)
Sodium: 140 mmol/L (ref 134–144)
Total Protein: 6.9 g/dL (ref 6.0–8.5)
eGFR: 84 mL/min/{1.73_m2} (ref >59)

## 2023-08-12 LAB — CBC
Hematocrit: 43.7 % (ref 34.0–46.6)
Hemoglobin: 13.9 g/dL (ref 11.1–15.9)
MCH: 27.9 pg (ref 26.6–33.0)
MCHC: 31.8 g/dL (ref 31.5–35.7)
MCV: 88 fL (ref 79–97)
Platelets: 349 10*3/uL (ref 150–450)
RBC: 4.99 x10E6/uL (ref 3.77–5.28)
RDW: 13 % (ref 11.7–15.4)
WBC: 9.2 10*3/uL (ref 3.4–10.8)

## 2023-08-12 LAB — LIPID PANEL
Chol/HDL Ratio: 3.3 {ratio} (ref 0.0–4.4)
Cholesterol, Total: 195 mg/dL (ref 100–199)
HDL: 59 mg/dL (ref >39)
LDL Chol Calc (NIH): 123 mg/dL — ABNORMAL HIGH (ref 0–99)
Triglycerides: 72 mg/dL (ref 0–149)
VLDL Cholesterol Cal: 13 mg/dL (ref 5–40)

## 2023-08-12 LAB — VITAMIN D 25 HYDROXY (VIT D DEFICIENCY, FRACTURES): Vit D, 25-Hydroxy: 55.8 ng/mL (ref 30.0–100.0)

## 2023-08-12 LAB — HEMOGLOBIN A1C
Est. average glucose Bld gHb Est-mCnc: 131 mg/dL
Hgb A1c MFr Bld: 6.2 % — ABNORMAL HIGH (ref 4.8–5.6)

## 2023-08-13 NOTE — Assessment & Plan Note (Signed)
 Previous labs reviewed, her A1c has been elevated in the past. I will check an A1c today. Reminded to avoid refined sugars including sugary drinks/foods and processed meats including bacon, sausages and deli meats.

## 2023-08-13 NOTE — Assessment & Plan Note (Signed)
 She is encouraged to strive for BMI less than 30 to decrease cardiac risk. Advised to aim for at least 150 minutes of exercise per week.

## 2023-08-24 ENCOUNTER — Telehealth: Payer: BC Managed Care – PPO | Admitting: Family

## 2023-08-24 DIAGNOSIS — R109 Unspecified abdominal pain: Secondary | ICD-10-CM

## 2023-08-24 DIAGNOSIS — A084 Viral intestinal infection, unspecified: Secondary | ICD-10-CM

## 2023-08-24 MED ORDER — ONDANSETRON HCL 4 MG PO TABS: 4.0000 mg | ORAL_TABLET | Freq: Three times a day (TID) | ORAL | 0 refills | Status: DC | PRN

## 2023-08-24 MED ORDER — DICYCLOMINE HCL 10 MG PO CAPS: 10.0000 mg | ORAL_CAPSULE | Freq: Three times a day (TID) | ORAL | 0 refills | Status: DC

## 2023-08-24 NOTE — Patient Instructions (Signed)

## 2023-08-24 NOTE — Progress Notes (Signed)
Virtual Visit Consent   VICKEE MORMINO, you are scheduled for a virtual visit with a [facility] provider today. Just as with appointments in the office, your consent must be obtained to participate. Your consent will be active for this visit and any virtual visit you may have with one of our providers in the next 365 days. If you have a MyChart account, a copy of this consent can be sent to you electronically.  As this is a virtual visit, video technology does not allow for your provider to perform a traditional examination. This may limit your provider's ability to fully assess your condition. If your provider identifies any concerns that need to be evaluated in person or the need to arrange testing (such as labs, EKG, etc.), we will make arrangements to do so. Although advances in technology are sophisticated, we cannot ensure that it will always work on either your end or our end. If the connection with a video visit is poor, the visit may have to be switched to a telephone visit. With either a video or telephone visit, we are not always able to ensure that we have a secure connection.  By engaging in this virtual visit, you consent to the provision of healthcare and authorize for your insurance to be billed (if applicable) for the services provided during this visit. Depending on your insurance coverage, you may receive a charge related to this service.  I need to obtain your verbal consent now. Are you willing to proceed with your visit today? Samantha Mcgee has provided verbal consent on 08/24/2023 for a virtual visit (video or telephone). Jannifer Rodney, FNP  Date: 08/24/2023 5:43 PM   Virtual Visit via Video Note   I, Jannifer Rodney, connected with  Samantha Mcgee  ([id number], 1965/12/25) on 08/24/23 at  6:30 PM EST by a video-enabled telemedicine application and verified that I am speaking with the correct person using two identifiers.  Location: Patient: Virtual Visit Location  Patient: Home Provider: Virtual Visit Location Provider: Home Office   I discussed the limitations of evaluation and management by telemedicine and the availability of in person appointments. The patient expressed understanding and agreed to proceed.    History of Present Illness: Samantha Mcgee is a 58 y.o. who identifies as a female who was assigned female at birth, and is being seen today for abdominal cramping, diarrhea that started yesterday .  HPI: Diarrhea  This is a new problem. The current episode started yesterday. The problem occurs 2 to 4 times per day. The problem has been waxing and waning. The stool consistency is described as Watery. Associated symptoms include abdominal pain, bloating, chills, headaches and myalgias. Pertinent negatives include no fever or vomiting. She has tried increased fluids and electrolyte solution for the symptoms. The treatment provided mild relief.    Problems:  Patient Active Problem List   Diagnosis Date Noted   Encounter for general adult medical examination w/o abnormal findings 08/11/2023   Vitamin D deficiency disease 08/11/2023   Left leg pain 02/16/2023   Class 2 severe obesity due to excess calories with serious comorbidity and body mass index (BMI) of 35.0 to 35.9 in adult [facility]) 02/16/2023   Prediabetes 01/10/2023   Chronic pain of both knees 01/10/2023   Class 1 obesity due to excess calories with body mass index (BMI) of 34.0 to 34.9 in adult 01/10/2023   Chronic insomnia 01/10/2023   Plantar fasciitis, bilateral 06/18/2018   Asthma 06/15/2018   Seasonal  allergies 06/15/2018   Status post total hysterectomy 03/30/2018    Allergies:  Allergies  Allergen Reactions   Latex Other (See Comments)    Blisters.   Medications:  Current Outpatient Medications:    dicyclomine (BENTYL) 10 MG capsule, Take 1 capsule (10 mg total) by mouth 4 (four) times daily -  before meals and at bedtime., Disp: 120 capsule, Rfl: 0   ondansetron  (ZOFRAN) 4 MG tablet, Take 1 tablet (4 mg total) by mouth every 8 (eight) hours as needed for nausea or vomiting., Disp: 20 tablet, Rfl: 0   albuterol (PROAIR HFA) 108 (90 Base) MCG/ACT inhaler, Use 2 puffs every 4 hours as needed for cough or wheeze.  May use 2 puffs 10-20 minutes prior to exercise., Disp: 1 Inhaler, Rfl: 1   amitriptyline (ELAVIL) 10 MG tablet, TAKE 3 TABLETS BY MOUTH AT BEDTIME AS NEEDED FOR SLEEP, Disp: 270 tablet, Rfl: 2   CALCIUM PO, Take by mouth., Disp: , Rfl:    cetirizine (ZYRTEC) 10 MG tablet, TAKE 1 TABLET BY MOUTH EVERY DAY FOR RUNNY NOSE OR ITCHING (Patient taking differently: Take 10 mg by mouth daily.), Disp: 90 tablet, Rfl: 1   Clindamycin-Benzoyl Per, Refr, gel, Apply 1 application topically daily., Disp: , Rfl: 3   meloxicam (MOBIC) 7.5 MG tablet, TAKE 1 TABLET BY MOUTH DAILY AS NEEDED. (Patient not taking: Reported on 08/11/2023), Disp: 30 tablet, Rfl: 1   mirabegron ER (MYRBETRIQ) 50 MG TB24 tablet, Take 50 mg by mouth daily., Disp: , Rfl:    montelukast (SINGULAIR) 10 MG tablet, Take 1 tablet (10 mg total) by mouth at bedtime., Disp: 30 tablet, Rfl: 5   Omega-3 1000 MG CAPS, Take 1,000 mg by mouth daily., Disp: , Rfl:    omeprazole (PRILOSEC) 40 MG capsule, TAKE 1 CAPSULE BY MOUTH TWICE A DAY 30 MINUTES BEFORE MORNING AND EVENING MEALS, Disp: 180 capsule, Rfl: 1   OVER THE COUNTER MEDICATION, Vit D 1000 daily, Disp: , Rfl:    polyethylene glycol (MIRALAX / GLYCOLAX) 17 g packet, Take 17 g by mouth daily. daily, Disp: , Rfl:    Probiotic Product (PROBIOTIC PO), Take 1 capsule by mouth 2 (two) times daily. ULTRA FLORA, Disp: , Rfl:    RETIN-A MICRO PUMP 0.08 % GEL, Apply 1 application topically at bedtime., Disp: , Rfl: 1   vitamin B-12 (CYANOCOBALAMIN) 500 MCG tablet, Take 500 mcg by mouth daily. , Disp: , Rfl:    WIXELA INHUB 250-50 MCG/ACT AEPB, Inhale 1 puff into the lungs 2 (two) times daily., Disp: , Rfl:    zolpidem (AMBIEN) 5 MG tablet, TAKE 1 TABLET BY  MOUTH EVERY DAY AT BEDTIME AS NEEDED, Disp: 30 tablet, Rfl: 1  Observations/Objective: Patient is well-developed, well-nourished in no acute distress.  Resting comfortably  at home.  Head is normocephalic, atraumatic.  No labored breathing.  Speech is clear and coherent with logical content.  Patient is alert and oriented at baseline.    Assessment and Plan: 1. Viral gastroenteritis (Primary) - ondansetron (ZOFRAN) 4 MG tablet; Take 1 tablet (4 mg total) by mouth every 8 (eight) hours as needed for nausea or vomiting.  Dispense: 20 tablet; Refill: 0  2. Abdominal cramping - dicyclomine (BENTYL) 10 MG capsule; Take 1 capsule (10 mg total) by mouth 4 (four) times daily -  before meals and at bedtime.  Dispense: 120 capsule; Refill: 0  Rest Force fluids Bland diet  Zofran as needed Bentyl as needed for abdominal cramping  Follow  up if symptoms worsen or do not improve   Follow Up Instructions: I discussed the assessment and treatment plan with the patient. The patient was provided an opportunity to ask questions and all were answered. The patient agreed with the plan and demonstrated an understanding of the instructions.  A copy of instructions were sent to the patient via MyChart unless otherwise noted below.     The patient was advised to call back or seek an in-person evaluation if the symptoms worsen or if the condition fails to improve as anticipated.    Jannifer Rodney, FNP

## 2023-09-15 ENCOUNTER — Other Ambulatory Visit: Payer: Self-pay | Admitting: Family

## 2023-09-15 DIAGNOSIS — R109 Unspecified abdominal pain: Secondary | ICD-10-CM

## 2023-09-20 ENCOUNTER — Telehealth: Payer: Self-pay

## 2023-09-20 NOTE — Telephone Encounter (Signed)
 Reason for CRM: please contact patient: taking amitriptyline (ELAVIL) 10 MG tablet .. feels like it's effecting her memory.. needs alternative.. she tried to reduce but the anxiety kicks back in.. [phone]   Patient advised to cut back on her meds to every other day for 2 weeks and then every 3 days. Patient advised she can't stop the medication cold [place]. Patient verbalized understanding. YL,RMA

## 2023-10-06 ENCOUNTER — Other Ambulatory Visit: Payer: Self-pay | Admitting: Internal Medicine

## 2023-12-04 ENCOUNTER — Other Ambulatory Visit: Payer: Self-pay | Admitting: Internal Medicine

## 2023-12-12 ENCOUNTER — Encounter: Payer: Self-pay | Admitting: Internal Medicine

## 2023-12-12 LAB — HM COLONOSCOPY

## 2024-01-04 ENCOUNTER — Telehealth: Payer: Self-pay | Admitting: Internal Medicine

## 2024-01-04 NOTE — Telephone Encounter (Unsigned)
 Copied from CRM [id number]. Topic: Referral - Question >> Jan 04, 2024  2:58 PM Donee H wrote: Reason for CRM: Patient called to stated she is having  reflux issues. She went to see her allergist today and it was suggested she see a gastroenterologist. Patient states issue has been going on since mid Feb. Patient also stated it was suggested to have an endoscopy of the throat to see what is going on. Patient is requesting a callback at [phone] for further instructions on what she should do

## 2024-01-09 ENCOUNTER — Ambulatory Visit (INDEPENDENT_AMBULATORY_CARE_PROVIDER_SITE_OTHER): Admitting: Internal Medicine

## 2024-01-09 ENCOUNTER — Encounter: Payer: Self-pay | Admitting: Internal Medicine

## 2024-01-09 VITALS — BP 110/82 | HR 85 | Temp 98.3°F | Ht 61.0 in | Wt 190.8 lb

## 2024-01-09 DIAGNOSIS — K219 Gastro-esophageal reflux disease without esophagitis: Secondary | ICD-10-CM | POA: Diagnosis not present

## 2024-01-09 DIAGNOSIS — R7303 Prediabetes: Secondary | ICD-10-CM | POA: Diagnosis not present

## 2024-01-09 DIAGNOSIS — R053 Chronic cough: Secondary | ICD-10-CM | POA: Diagnosis not present

## 2024-01-09 DIAGNOSIS — J4530 Mild persistent asthma, uncomplicated: Secondary | ICD-10-CM | POA: Diagnosis not present

## 2024-01-09 MED ORDER — BENZONATATE 100 MG PO CAPS: 100.0000 mg | ORAL_CAPSULE | Freq: Three times a day (TID) | ORAL | 1 refills | Status: DC | PRN

## 2024-01-09 MED ORDER — ALBUTEROL SULFATE HFA 108 (90 BASE) MCG/ACT IN AERS: INHALATION_SPRAY | RESPIRATORY_TRACT | 1 refills | Status: AC

## 2024-01-09 NOTE — Patient Instructions (Signed)
 GERD in Adults: Diet Changes When you have gastroesophageal reflux disease (GERD), you may need to make changes to your diet. Choosing the right foods can help with your symptoms. Think about working with an expert in healthy eating called a dietitian. They can help you make healthy food choices. What are tips for following this plan? Reading food labels Look for foods that are low in saturated fat. Foods that may help with your symptoms include: Foods with less than 5% of daily value (DV) of fat. Foods with 0 grams of trans fat. Cooking [employer] in ways that don't use a lot of fat. These ways include: Baking. Steaming. Grilling. Broiling. To add flavor, try to use herbs that are low in spice and acidity. Avoid frying your food. Meal planning  Eat small meals often rather than eating 3 large meals each day. Eat your meals slowly in a place where you feel relaxed. If told by your health care provider, avoid: Foods that cause symptoms. Keep a food diary to keep track of foods that cause symptoms. Alcohol. Drinking a lot of liquid with meals. General instructions For 2-3 hours after you eat, avoid: Bending over. Exercise. Lying down. Chew sugar-free gum after meals. What foods should I eat? Eat a healthy diet. Try to include: Foods with high amounts of fiber. These include: Fruits and vegetables. Whole grains and beans. Low-fat dairy products. Lean meats, fish, and poultry. Egg whites. Foods that cause symptoms in someone else may not cause symptoms for you. Work with your provider to find foods that are safe for you. The items listed above may not be all the foods and drinks you can have. Talk with a dietitian to learn more. The items listed above may not be a complete list of foods and beverages you can eat and drink. Contact a dietitian for more information. What foods should I avoid? Limiting some of these foods may help with your symptoms. Each person is different.  Talk with a dietitian or your provider to help you find the exact foods to avoid. Some of the foods to avoid may include: Fruits Fruits with a lot of acid in them. These may include citrus fruits, such as oranges, grapefruit, pineapple, and lemons. Vegetables Deep-fried vegetables, such as [place] fries. Vegetables, sauces, or toppings made with added fat and vegetables with acid in them. These may include tomatoes and tomato products, chili peppers, onions, garlic, and horseradish. Grains Pastries or quick breads with added fat. Meats and other proteins High-fat meats, such as fatty beef or pork, hot dogs, ribs, ham, sausage, salami, and bacon. Fried meat or protein, such as fried fish and fried chicken. Egg yolks. Fats and oils Butter. Margarine. Shortening. Ghee. Drinks Coffee and other drinks with caffeine in them. Fizzy and sugary drinks, such as soda and energy drinks. Fruit juice made with acidic fruits, such as orange or grapefruit. Tomato juice. Sweets and desserts Chocolate and cocoa. Donuts. Seasonings and condiments Mint, such as peppermint and spearmint. Condiments, herbs, or seasonings that cause symptoms. These may include curry, hot sauce, or vinegar-based salad dressings. The items listed above may not be all the foods and drinks you should avoid. Talk with a dietitian to learn more. Questions to ask your health care provider Changes to your diet and everyday life are often the first steps taken to manage symptoms of GERD. If these changes don't help, talk with your provider about taking medicines. Where to find more information [employer]:  aboutgerd.org This information is not intended to replace advice given to you by your health care provider. Make sure you discuss any questions you have with your health care provider. Document Revised: 05/10/2023 Document Reviewed: 11/24/2022 Elsevier Patient Education  2024 [employer].

## 2024-01-09 NOTE — Progress Notes (Signed)
 I,Victoria T Emmitt, CMA,acting as a Neurosurgeon for Catheryn LOISE Slocumb, MD.,have documented all relevant documentation on the behalf of Catheryn LOISE Slocumb, MD,as directed by  Catheryn LOISE Slocumb, MD while in the presence of Catheryn LOISE Slocumb, MD.  Subjective:  Patient ID: Samantha Mcgee , female    DOB: 1965-11-16 , 57 y.o.   MRN: [id number]  Chief Complaint  Patient presents with   Referral    Patient presents today for referral to GI. She reports having issues with reflux recently. She wakes up during the night with excessive coughing. She has to use albuterol  inhaler most times for relief.  She has had an episode where she projectile vomited. A month ago.  She has noticed if she eats anything with spice. It triggers GERD.    HPI Discussed the use of AI scribe software for clinical note transcription with the patient, who gave verbal consent to proceed.  History of Present Illness Samantha Mcgee is a 58 year old female who presents with worsening reflux symptoms.  She experiences worsening reflux symptoms, including a chronic cough that disrupts her sleep, despite taking omeprazole once daily at the highest dosage. She previously took two doses of omeprazole as advised by urgent care but was later informed that this was excessive. She also takes Pepcid in addition to omeprazole, with no improvement in symptoms. Spicy foods, such as spicy soup, exacerbate her symptoms.  She has never experienced waking up coughing before. During severe episodes, she tried Sudafed without relief, but found some relief with albuterol , which she refers to as 'a hit'. Her current medications include omeprazole once daily, Pepcid, Singulair , Zyrtec , and a preventive inhaler (fluticasone ) taken twice daily.   She mentions a previous respiratory infection in April for which she received a steroid shot but no antibiotics. Her nasal discharge is white, indicating no infection. She takes Ambien  only when needed and has  reduced her amitriptyline  dosage from three pills to two.   Past Medical History:  Diagnosis Date   Anxiety    Arthritis    Asthma    Headache    History of kidney stones    Interstitial cystitis    Seasonal allergies      Family History  Problem Relation Age of Onset   Diabetes Mother    Stroke Mother    Hyperlipidemia Mother    Arthritis Father    Heart attack Father    Hyperlipidemia Father    Eczema Brother    Cancer Brother    Eczema Daughter    Allergic rhinitis Neg Hx    Angioedema Neg Hx    Asthma Neg Hx    Immunodeficiency Neg Hx    Urticaria Neg Hx      Current Outpatient Medications:    amitriptyline  (ELAVIL ) 10 MG tablet, TAKE 3 TABLETS BY MOUTH AT BEDTIME AS NEEDED FOR SLEEP, Disp: 90 tablet, Rfl: 1   benzonatate (TESSALON PERLES) 100 MG capsule, Take 1 capsule (100 mg total) by mouth 3 (three) times daily as needed for cough., Disp: 30 capsule, Rfl: 1   CALCIUM PO, Take by mouth., Disp: , Rfl:    cetirizine  (ZYRTEC ) 10 MG tablet, TAKE 1 TABLET BY MOUTH EVERY DAY FOR RUNNY NOSE OR ITCHING (Patient taking differently: Take 10 mg by mouth daily.), Disp: 90 tablet, Rfl: 1   Clindamycin-Benzoyl Per, Refr, gel, Apply 1 application topically daily., Disp: , Rfl: 3   dicyclomine  (BENTYL ) 10 MG capsule, Take 1 capsule (10 mg total) by  mouth 4 (four) times daily -  before meals and at bedtime., Disp: 120 capsule, Rfl: 0   mirabegron ER (MYRBETRIQ) 50 MG TB24 tablet, Take 50 mg by mouth daily., Disp: , Rfl:    montelukast  (SINGULAIR ) 10 MG tablet, Take 1 tablet (10 mg total) by mouth at bedtime., Disp: 30 tablet, Rfl: 5   Omega-3 1000 MG CAPS, Take 1,000 mg by mouth daily., Disp: , Rfl:    omeprazole (PRILOSEC) 40 MG capsule, TAKE 1 CAPSULE BY MOUTH TWICE A DAY 30 MINUTES BEFORE MORNING AND EVENING MEALS, Disp: 180 capsule, Rfl: 1   ondansetron  (ZOFRAN ) 4 MG tablet, Take 1 tablet (4 mg total) by mouth every 8 (eight) hours as needed for nausea or vomiting., Disp: 20  tablet, Rfl: 0   OVER THE COUNTER MEDICATION, Vit D 1000 daily, Disp: , Rfl:    polyethylene glycol (MIRALAX / GLYCOLAX) 17 g packet, Take 17 g by mouth daily. daily, Disp: , Rfl:    Probiotic Product (PROBIOTIC PO), Take 1 capsule by mouth 2 (two) times daily. ULTRA FLORA, Disp: , Rfl:    RETIN-A MICRO PUMP 0.08 % GEL, Apply 1 application topically at bedtime., Disp: , Rfl: 1   vitamin B-12 (CYANOCOBALAMIN) 500 MCG tablet, Take 500 mcg by mouth daily. , Disp: , Rfl:    WIXELA INHUB 250-50 MCG/ACT AEPB, Inhale 1 puff into the lungs 2 (two) times daily., Disp: , Rfl:    zolpidem  (AMBIEN ) 5 MG tablet, TAKE 1 TABLET BY MOUTH EVERY DAY AT BEDTIME AS NEEDED, Disp: 30 tablet, Rfl: 1   albuterol  (PROAIR  HFA) 108 (90 Base) MCG/ACT inhaler, Use 2 puffs every 4 hours as needed for cough or wheeze.  May use 2 puffs 10-20 minutes prior to exercise., Disp: 3 each, Rfl: 1   meloxicam  (MOBIC ) 7.5 MG tablet, TAKE 1 TABLET BY MOUTH DAILY AS NEEDED. (Patient not taking: Reported on 01/09/2024), Disp: 30 tablet, Rfl: 1   Allergies  Allergen Reactions   Latex Other (See Comments)    Blisters.     Review of Systems  Constitutional: Negative.   Respiratory:  Positive for cough.   Cardiovascular: Negative.   Gastrointestinal: Negative.   Neurological: Negative.   Psychiatric/Behavioral: Negative.       Today's Vitals   01/09/24 1548  BP: 110/82  Pulse: 85  Temp: 98.3 F (36.8 C)  SpO2: 98%  Weight: 190 lb 12.8 oz (86.5 kg)  Height: 5' 1 (1.549 m)   Body mass index is 36.05 kg/m.  Wt Readings from Last 3 Encounters:  01/09/24 190 lb 12.8 oz (86.5 kg)  08/11/23 190 lb 6.4 oz (86.4 kg)  02/15/23 186 lb (84.4 kg)     Objective:  Physical Exam Vitals and nursing note reviewed.  Constitutional:      Appearance: Normal appearance.  HENT:     Head: Normocephalic and atraumatic.   Eyes:     Extraocular Movements: Extraocular movements intact.    Cardiovascular:     Rate and Rhythm: Normal  rate and regular rhythm.     Heart sounds: Normal heart sounds.  Pulmonary:     Effort: Pulmonary effort is normal.     Breath sounds: Normal breath sounds.   Musculoskeletal:     Cervical back: Normal range of motion.   Skin:    General: Skin is warm.   Neurological:     General: No focal deficit present.     Mental Status: She is alert.   Psychiatric:  Mood and Affect: Mood normal.        Behavior: Behavior normal.         Assessment And Plan:  Gastroesophageal reflux disease without esophagitis Assessment & Plan: Worsening reflux symptoms with nocturnal coughing despite current medication. Symptoms exacerbated by spicy foods. Possible contribution to chronic cough. - Refer to gastroenterologist for GERD and chronic cough evaluation. - Consider increasing omeprazole to two daily until seen by GI, or switch to one omeprazole and a Pepcid, per patient preference. - Avoid spicy foods and eating three hours before bedtime. - Try almond milk (vanilla unsweetened) as a dairy alternative.  Orders: -     Ambulatory referral to Gastroenterology  Chronic cough Assessment & Plan: Suspected relation to GERD and sinus drainage. - Refer to gastroenterologist for chronic cough evaluation. - Prescribe Tessalon Perles for symptomatic relief. - Ensure albuterol  inhaler is available as needed. - May need ENT evaluation   Mild persistent asthma, uncomplicated Assessment & Plan: Chronic, stable. Most recent Allergy evaluation reviewed.  - Continue with Wixela with bid dosing - Use albuterol  prn  Orders: -     Albuterol  Sulfate HFA; Use 2 puffs every 4 hours as needed for cough or wheeze.  May use 2 puffs 10-20 minutes prior to exercise.  Dispense: 3 each; Refill: 1  Prediabetes Assessment & Plan: Previous labs reviewed, her A1c has been elevated in the past. I will check an A1c today. Reminded to avoid refined sugars including sugary drinks/foods and processed meats  including bacon, sausages and deli meats.    Orders: -     CMP14+EGFR; Future -     Hemoglobin A1c; Future  Other orders -     Benzonatate; Take 1 capsule (100 mg total) by mouth 3 (three) times daily as needed for cough.  Dispense: 30 capsule; Refill: 1   Return in 1 week (on 01/16/2024), or lab visit.  Patient was given opportunity to ask questions. Patient verbalized understanding of the plan and was able to repeat key elements of the plan. All questions were answered to their satisfaction.    I, Catheryn LOISE Slocumb, MD, have reviewed all documentation for this visit. The documentation on 01/09/24 for the exam, diagnosis, procedures, and orders are all accurate and complete.   IF YOU HAVE BEEN REFERRED TO A SPECIALIST, IT MAY TAKE 1-2 WEEKS TO SCHEDULE/PROCESS THE REFERRAL. IF YOU HAVE NOT HEARD FROM US /SPECIALIST IN TWO WEEKS, PLEASE GIVE US  A CALL AT [phone].   THE PATIENT IS ENCOURAGED TO PRACTICE SOCIAL DISTANCING DUE TO THE COVID-19 PANDEMIC.

## 2024-01-10 DIAGNOSIS — R053 Chronic cough: Secondary | ICD-10-CM | POA: Insufficient documentation

## 2024-01-10 DIAGNOSIS — J4530 Mild persistent asthma, uncomplicated: Secondary | ICD-10-CM | POA: Insufficient documentation

## 2024-01-10 NOTE — Assessment & Plan Note (Signed)
 Chronic, stable. Most recent Allergy evaluation reviewed.  - Continue with Wixela with bid dosing - Use albuterol  prn

## 2024-01-10 NOTE — Assessment & Plan Note (Signed)
 Suspected relation to GERD and sinus drainage. - Refer to gastroenterologist for chronic cough evaluation. - Prescribe Tessalon Perles for symptomatic relief. - Ensure albuterol  inhaler is available as needed. - May need ENT evaluation

## 2024-01-10 NOTE — Assessment & Plan Note (Signed)
 Worsening reflux symptoms with nocturnal coughing despite current medication. Symptoms exacerbated by spicy foods. Possible contribution to chronic cough. - Refer to gastroenterologist for GERD and chronic cough evaluation. - Consider increasing omeprazole to two daily until seen by GI, or switch to one omeprazole and a Pepcid, per patient preference. - Avoid spicy foods and eating three hours before bedtime. - Try almond milk (vanilla unsweetened) as a dairy alternative.

## 2024-01-10 NOTE — Assessment & Plan Note (Signed)
 Previous labs reviewed, her A1c has been elevated in the past. I will check an A1c today. Reminded to avoid refined sugars including sugary drinks/foods and processed meats including bacon, sausages and deli meats.

## 2024-01-16 ENCOUNTER — Other Ambulatory Visit

## 2024-01-16 DIAGNOSIS — R7303 Prediabetes: Secondary | ICD-10-CM

## 2024-01-16 LAB — CMP14+EGFR
ALT: 11 IU/L (ref 0–32)
AST: 13 IU/L (ref 0–40)
Albumin: 4.1 g/dL (ref 3.8–4.9)
Alkaline Phosphatase: 106 IU/L (ref 44–121)
BUN/Creatinine Ratio: 11 (ref 9–23)
BUN: 9 mg/dL (ref 6–24)
Bilirubin Total: 0.3 mg/dL (ref 0.0–1.2)
CO2: 22 mmol/L (ref 20–29)
Calcium: 9.4 mg/dL (ref 8.7–10.2)
Chloride: 103 mmol/L (ref 96–106)
Creatinine, Ser: 0.85 mg/dL (ref 0.57–1.00)
Globulin, Total: 3.1 g/dL (ref 1.5–4.5)
Glucose: 87 mg/dL (ref 70–99)
Potassium: 4.6 mmol/L (ref 3.5–5.2)
Sodium: 140 mmol/L (ref 134–144)
Total Protein: 7.2 g/dL (ref 6.0–8.5)
eGFR: 80 mL/min/1.73 (ref >59)

## 2024-01-16 LAB — HEMOGLOBIN A1C
Est. average glucose Bld gHb Est-mCnc: 120 mg/dL
Hgb A1c MFr Bld: 5.8 % — ABNORMAL HIGH (ref 4.8–5.6)

## 2024-01-21 ENCOUNTER — Ambulatory Visit: Payer: Self-pay | Admitting: Internal Medicine

## 2024-02-19 ENCOUNTER — Other Ambulatory Visit: Payer: Self-pay | Admitting: Internal Medicine

## 2024-04-19 ENCOUNTER — Other Ambulatory Visit: Payer: Self-pay | Admitting: Urology

## 2024-04-22 ENCOUNTER — Other Ambulatory Visit: Payer: Self-pay | Admitting: Internal Medicine

## 2024-04-23 NOTE — Patient Instructions (Signed)
 SURGICAL WAITING ROOM VISITATION Patients having surgery or a procedure may have no more than 2 support people in the waiting area - these visitors may rotate.    Children under the age of 10 must have an adult with them who is not the patient.  If the patient needs to stay at the hospital during part of their recovery, the visitor guidelines for inpatient rooms apply. Pre-op nurse will coordinate an appropriate time for 1 support person to accompany patient in pre-op.  This support person may not rotate.    Please refer to the [facility] website for the visitor guidelines for Inpatients (after your surgery is over and you are in a regular room).       Your procedure is scheduled on: 05-04-24   Report to [facility] Main Entrance    Report to admitting at 8:45 AM   Call this number if you have problems the morning of surgery [phone]   Do not eat food or drink liquids:After Midnight.           If you have questions, please contact your surgeon's office.   FOLLOW  ANY ADDITIONAL PRE OP INSTRUCTIONS YOU RECEIVED FROM YOUR SURGEON'S OFFICE!!!     Oral Hygiene is also important to reduce your risk of infection.                                    Remember - BRUSH YOUR TEETH THE MORNING OF SURGERY WITH YOUR REGULAR TOOTHPASTE   Do NOT smoke after Midnight   Take these medicines the morning of surgery with A SIP OF WATER:    Myrbetriq   Omeprazole   Zyrtec    Okay to use inhalers  Stop all vitamins and herbal supplements 7 days before surgery                              You may not have any metal on your body including hair pins, jewelry, and body piercing             Do not wear make-up, lotions, powders, perfumes or deodorant  Do not wear nail polish including gel and S&S, artificial/acrylic nails, or any other type of covering on natural nails including finger and toenails. If you have artificial nails, gel coating, etc. that needs to be removed by a nail salon  please have this removed prior to surgery or surgery may need to be canceled/ delayed if the surgeon/ anesthesia feels like they are unable to be safely monitored.   Do not shave  48 hours prior to surgery.    Do not bring valuables to the hospital. [facility] IS NOT RESPONSIBLE   FOR VALUABLES.   Contacts, dentures or bridgework may not be worn into surgery.   DO NOT BRING YOUR HOME MEDICATIONS TO THE HOSPITAL. PHARMACY WILL DISPENSE MEDICATIONS LISTED ON YOUR MEDICATION LIST TO YOU DURING YOUR ADMISSION IN THE HOSPITAL!    Patients discharged on the day of surgery will not be allowed to drive home.  Someone NEEDS to stay with you for the first 24 hours after anesthesia.    Please read over the following fact sheets you were given: IF YOU HAVE QUESTIONS ABOUT YOUR PRE-OP INSTRUCTIONS PLEASE CALL [phone] G[facility]  If you received a COVID test during your pre-op visit  it is requested that you wear a  mask when out in public, stay away from anyone that may not be feeling well and notify your surgeon if you develop symptoms. If you test positive for Covid or have been in contact with anyone that has tested positive in the last 10 days please notify you surgeon.  [facility] - Preparing for Surgery Before surgery, you can play an important role.  Because skin is not sterile, your skin needs to be as free of germs as possible.  You can reduce the number of germs on your skin by washing with CHG (chlorahexidine gluconate) soap before surgery.  CHG is an antiseptic cleaner which kills germs and bonds with the skin to continue killing germs even after washing. Please DO NOT use if you have an allergy to CHG or antibacterial soaps.  If your skin becomes reddened/irritated stop using the CHG and inform your nurse when you arrive at Short Stay. Do not shave (including legs and underarms) for at least 48 hours prior to the first CHG shower.  You may shave your face/neck.  Please follow these instructions  carefully:  1.  Shower with CHG Soap the night before surgery and the  morning of surgery.  2.  If you choose to wash your hair, wash your hair first as usual with your normal  shampoo.  3.  After you shampoo, rinse your hair and body thoroughly to remove the shampoo.                             4.  Use CHG as you would any other liquid soap.  You can apply chg directly to the skin and wash.  Gently with a scrungie or clean washcloth.  5.  Apply the CHG Soap to your body ONLY FROM THE NECK DOWN.   Do   not use on face/ open                           Wound or open sores. Avoid contact with eyes, ears mouth and   genitals (private parts).                       Wash face,  Genitals (private parts) with your normal soap.             6.  Wash thoroughly, paying special attention to the area where your    surgery  will be performed.  7.  Thoroughly rinse your body with warm water from the neck down.  8.  DO NOT shower/wash with your normal soap after using and rinsing off the CHG Soap.                9.  Pat yourself dry with a clean towel.            10.  Wear clean pajamas.            11.  Place clean sheets on your bed the night of your first shower and do not  sleep with pets. Day of Surgery : Do not apply any lotions/deodorants the morning of surgery.  Please wear clean clothes to the hospital/surgery center.  FAILURE TO FOLLOW THESE INSTRUCTIONS MAY RESULT IN THE CANCELLATION OF YOUR SURGERY  PATIENT SIGNATURE_________________________________  NURSE SIGNATURE__________________________________  ________________________________________________________________________

## 2024-04-26 ENCOUNTER — Encounter (HOSPITAL_COMMUNITY): Payer: Self-pay

## 2024-04-26 ENCOUNTER — Encounter (HOSPITAL_COMMUNITY)
Admission: RE | Admit: 2024-04-26 | Discharge: 2024-04-26 | Disposition: A | Discharge status: Home / Self Care | Source: Ambulatory Visit | Attending: Urology | Admitting: Urology

## 2024-04-26 ENCOUNTER — Other Ambulatory Visit: Payer: Self-pay

## 2024-04-26 DIAGNOSIS — Z01818 Encounter for other preprocedural examination: Secondary | ICD-10-CM | POA: Insufficient documentation

## 2024-04-26 HISTORY — DX: Gastro-esophageal reflux disease without esophagitis: K21.9

## 2024-04-26 NOTE — Progress Notes (Addendum)
  Date of COVID positive in last 90 days:  No  PCP - Catheryn Slocumb, MD Cardiologist -  N/A  Chest x-ray - N/A EKG - N/A Stress Test - N/A ECHO - N/A Cardiac Cath - N/A Pacemaker/ICD device last checked:N/A Spinal Cord Stimulator:N/A  Bowel Prep - N/A  Sleep Study - N/A CPAP -   Fasting Blood Sugar - N/A Checks Blood Sugar _____ times a day  Last dose of GLP1 agonist-  N/A GLP1 instructions:  Do not take after     Last dose of SGLT-2 inhibitors-  N/A SGLT-2 instructions:  Do not take after    Blood Thinner Instructions: N/A Last dose:   Time: Aspirin Instructions:N/A Last Dose:  Activity level:  Can go up a flight of stairs and perform activities of daily living without stopping and without symptoms of chest pain or shortness of breath.  Able to exercise without symptoms  Anesthesia review: N/A  Patient denies shortness of breath, fever, cough and chest pain at PAT appointment  Patient verbalized understanding of instructions that were given to them at the PAT appointment. Patient was also instructed that they will need to review over the PAT instructions again at home before surgery.

## 2024-05-04 ENCOUNTER — Encounter (HOSPITAL_COMMUNITY)
Admission: RE | Disposition: A | Discharge status: Home / Self Care | Payer: Self-pay | Source: Ambulatory Visit | Attending: Urology

## 2024-05-04 ENCOUNTER — Other Ambulatory Visit: Payer: Self-pay

## 2024-05-04 ENCOUNTER — Ambulatory Visit (HOSPITAL_COMMUNITY): Admitting: Certified Registered"

## 2024-05-04 ENCOUNTER — Encounter (HOSPITAL_COMMUNITY): Payer: Self-pay | Admitting: Urology

## 2024-05-04 ENCOUNTER — Ambulatory Visit (HOSPITAL_COMMUNITY)
Admission: RE | Admit: 2024-05-04 | Discharge: 2024-05-04 | Disposition: A | Discharge status: Home / Self Care | Source: Ambulatory Visit | Attending: Urology | Admitting: Urology

## 2024-05-04 ENCOUNTER — Ambulatory Visit (HOSPITAL_COMMUNITY): Payer: Self-pay | Admitting: Physician Assistant

## 2024-05-04 DIAGNOSIS — K219 Gastro-esophageal reflux disease without esophagitis: Secondary | ICD-10-CM | POA: Diagnosis not present

## 2024-05-04 DIAGNOSIS — N3941 Urge incontinence: Secondary | ICD-10-CM | POA: Insufficient documentation

## 2024-05-04 DIAGNOSIS — N3281 Overactive bladder: Secondary | ICD-10-CM | POA: Diagnosis present

## 2024-05-04 DIAGNOSIS — M1990 Unspecified osteoarthritis, unspecified site: Secondary | ICD-10-CM | POA: Diagnosis not present

## 2024-05-04 DIAGNOSIS — J45909 Unspecified asthma, uncomplicated: Secondary | ICD-10-CM | POA: Insufficient documentation

## 2024-05-04 HISTORY — PX: CYSTOSCOPY WITH INJECTION: SHX1424

## 2024-05-04 SURGERY — CYSTOSCOPY, WITH INJECTION OF BLADDER NECK OR BLADDER WALL
Anesthesia: General | Site: Bladder

## 2024-05-04 MED ORDER — STERILE WATER FOR IRRIGATION IR SOLN
Status: DC | PRN
  Administered 2024-05-04: 3000 mL

## 2024-05-04 MED ORDER — LACTATED RINGERS IV SOLN: INTRAVENOUS | Status: DC

## 2024-05-04 MED ORDER — CEFAZOLIN SODIUM-DEXTROSE 2-4 GM/100ML-% IV SOLN
2.0000 g | INTRAVENOUS | Status: AC
  Administered 2024-05-04: 2 g via INTRAVENOUS
  Filled 2024-05-04: qty 100

## 2024-05-04 MED ORDER — ONABOTULINUMTOXINA 100 UNITS IJ SOLR
INTRAMUSCULAR | Status: AC
  Filled 2024-05-04: qty 200

## 2024-05-04 MED ORDER — SODIUM CHLORIDE (PF) 0.9 % IJ SOLN
INTRAMUSCULAR | Status: AC
  Filled 2024-05-04: qty 20

## 2024-05-04 MED ORDER — ORAL CARE MOUTH RINSE: 15.0000 mL | Freq: Once | OROMUCOSAL | Status: AC

## 2024-05-04 MED ORDER — CHLORHEXIDINE GLUCONATE 0.12 % MT SOLN
15.0000 mL | Freq: Once | OROMUCOSAL | Status: AC
  Administered 2024-05-04: 15 mL via OROMUCOSAL

## 2024-05-04 MED ORDER — ONDANSETRON HCL 4 MG/2ML IJ SOLN
INTRAMUSCULAR | Status: AC
  Filled 2024-05-04: qty 2

## 2024-05-04 MED ORDER — FENTANYL CITRATE (PF) 50 MCG/ML IJ SOSY: 25.0000 ug | PREFILLED_SYRINGE | INTRAMUSCULAR | Status: DC | PRN

## 2024-05-04 MED ORDER — PROPOFOL 10 MG/ML IV BOLUS
INTRAVENOUS | Status: AC
Start: 2024-05-04 — End: 2024-05-04
  Filled 2024-05-04: qty 20

## 2024-05-04 MED ORDER — LIDOCAINE HCL (CARDIAC) PF 100 MG/5ML IV SOSY: PREFILLED_SYRINGE | INTRAVENOUS | Status: DC | PRN

## 2024-05-04 MED ORDER — MIDAZOLAM HCL 2 MG/2ML IJ SOLN
INTRAMUSCULAR | Status: AC
  Filled 2024-05-04: qty 2

## 2024-05-04 MED ORDER — ACETAMINOPHEN 10 MG/ML IV SOLN: 1000.0000 mg | Freq: Once | INTRAVENOUS | Status: DC | PRN

## 2024-05-04 MED ORDER — PROPOFOL 10 MG/ML IV BOLUS
INTRAVENOUS | Status: DC | PRN
  Administered 2024-05-04: 160 mg via INTRAVENOUS

## 2024-05-04 MED ORDER — LIDOCAINE HCL (PF) 2 % IJ SOLN
INTRAMUSCULAR | Status: AC
  Filled 2024-05-04: qty 5

## 2024-05-04 MED ORDER — DEXAMETHASONE SOD PHOSPHATE PF 10 MG/ML IJ SOLN
INTRAMUSCULAR | Status: DC | PRN
  Administered 2024-05-04: 8 mg via INTRAVENOUS

## 2024-05-04 MED ORDER — ONDANSETRON HCL 4 MG/2ML IJ SOLN
INTRAMUSCULAR | Status: DC | PRN
  Administered 2024-05-04: 4 mg via INTRAVENOUS

## 2024-05-04 MED ORDER — FENTANYL CITRATE (PF) 100 MCG/2ML IJ SOLN
INTRAMUSCULAR | Status: AC
  Filled 2024-05-04: qty 2

## 2024-05-04 MED ORDER — MIDAZOLAM HCL (PF) 2 MG/2ML IJ SOLN
INTRAMUSCULAR | Status: DC | PRN
  Administered 2024-05-04: 2 mg via INTRAVENOUS

## 2024-05-04 MED ORDER — ONABOTULINUMTOXINA 100 UNITS IJ SOLR: 200.0000 [IU] | Freq: Once | INTRAMUSCULAR | Status: DC

## 2024-05-04 MED ORDER — FENTANYL CITRATE (PF) 100 MCG/2ML IJ SOLN
INTRAMUSCULAR | Status: DC | PRN
  Administered 2024-05-04: 50 ug via INTRAVENOUS

## 2024-05-04 MED ORDER — DROPERIDOL 2.5 MG/ML IJ SOLN: 0.6250 mg | Freq: Once | INTRAMUSCULAR | Status: DC | PRN

## 2024-05-04 MED ORDER — LIDOCAINE HCL (PF) 2 % IJ SOLN
INTRAMUSCULAR | Status: DC | PRN
  Administered 2024-05-04: 100 mg via INTRADERMAL

## 2024-05-04 MED ORDER — PROPOFOL 10 MG/ML IV BOLUS
INTRAVENOUS | Status: AC
  Filled 2024-05-04: qty 20

## 2024-05-04 MED ORDER — ONABOTULINUMTOXINA 100 UNITS IJ SOLR
INTRAMUSCULAR | Status: DC | PRN
  Administered 2024-05-04: 200 [IU] via INTRAMUSCULAR

## 2024-05-04 SURGICAL SUPPLY — 15 items
BAG URO CATCHER STRL LF (MISCELLANEOUS) ×2 IMPLANT
CLOTH BEACON ORANGE TIMEOUT ST (SAFETY) ×2 IMPLANT
GLOVE SURG LX STRL 7.5 STRW (GLOVE) ×2 IMPLANT
GOWN STRL REUS W/ TWL LRG LVL3 (GOWN DISPOSABLE) ×4 IMPLANT
GOWN STRL REUS W/ TWL XL LVL3 (GOWN DISPOSABLE) ×4 IMPLANT
KIT TURNOVER KIT A (KITS) ×2 IMPLANT
MANIFOLD NEPTUNE II (INSTRUMENTS) ×2 IMPLANT
NDL ASPIRATION 22 (NEEDLE) ×2 IMPLANT
NDL SAFETY ECLIPSE 18X1.5 (NEEDLE) IMPLANT
NEEDLE ASPIRATION 22 (NEEDLE) ×1 IMPLANT
PACK CYSTO (CUSTOM PROCEDURE TRAY) ×2 IMPLANT
PENCIL SMOKE EVACUATOR (MISCELLANEOUS) IMPLANT
SYR CONTROL 10ML LL (SYRINGE) IMPLANT
TUBING CONNECTING 10 (TUBING) IMPLANT
WATER STERILE IRR 3000ML UROMA (IV SOLUTION) ×2 IMPLANT

## 2024-05-04 NOTE — Interval H&P Note (Signed)
 History and Physical Interval Note:  05/04/2024 12:30 PM  Samantha Mcgee  has presented today for surgery, with the diagnosis of OVERACTIVE BLADDER.  The various methods of treatment have been discussed with the patient and family. After consideration of risks, benefits and other options for treatment, the patient has consented to  Procedure(s) with comments: CYSTOSCOPY, WITH INJECTION OF BLADDER NECK OR BLADDER WALL (N/A) - CYSTOSCOPY WITH BLADDER BOTOX INSTILLATION (200 UNITS) as a surgical intervention.  The patient's history has been reviewed, patient examined, no change in status, stable for surgery.  I have reviewed the patient's chart and labs.  Questions were answered to the patient's satisfaction.     Morene LELON Salines

## 2024-05-04 NOTE — Discharge Instructions (Signed)
 CYSTOSCOPY HOME CARE INSTRUCTIONS  Activity: Rest for the remainder of the day.  Do not drive or operate equipment today.  You may resume normal activities in one to two days as instructed by your physician.   Meals: Drink plenty of liquids and eat light foods such as gelatin or soup this evening.  You may return to a normal meal plan tomorrow.  Return to Work: You may return to work in one to two days or as instructed by your physician.  Special Instructions / Symptoms: Call your physician if any of these symptoms occur:   -persistent or heavy bleeding  -bleeding which continues after first few urination  -large blood clots that are difficult to pass  -urine stream diminishes or stops completely  -fever equal to or higher than 101 degrees Farenheit.  -cloudy urine with a strong, foul odor  -severe pain  Females should always wipe from front to back after elimination.  You may feel some burning pain when you urinate.  This should disappear with time.  Applying moist heat to the lower abdomen or a hot tub bath may help relieve the pain. \

## 2024-05-04 NOTE — Op Note (Signed)
 Preoperative diagnosis:  Refractory urge incontinence   Postoperative diagnosis:  same   Procedure: Cystoscopy, bladder botox instillation (200 international unit )  Surgeon: Morene MICAEL Salines, MD  Anesthesia: General  Complications: None  Intraoperative findings:  1: The patient's bladder mucosa was normal-appearing without evidence of tumor or mucosal abnormalities.  The ureteral orifice ease were orthotopic.  There is no evidence of prolapse. 2.:  The Botox was instilled along the trigone and posterior bladder wall and 20 separate injection sites.  EBL: Minimal  Specimens: None  Indication: Samantha Mcgee is a 58 y.o. patient with refractory urgency incontinence.  After reviewing the management options for treatment, he elected to proceed with the above surgical procedure(s). We have discussed the potential benefits and risks of the procedure, side effects of the proposed treatment, the likelihood of the patient achieving the goals of the procedure, and any potential problems that might occur during the procedure or recuperation. Informed consent has been obtained.  Description of procedure:  Consent was obtained in the preoperative holding area.  She was then brought back to the operating room placed on table supine position.  General esthesia was then induced and endotracheal tube inserted.  She was placed in dorsolithotomy position and prepped and draped in the routine sterile fashion.  A timeout was subsequently performed.  21 French 0 degree cystoscope with the Botox needle was gently passed through the patient's urethra and into the bladder under visual guidance.  I then injected the tip of the needle into the submucosa of the bladder and instilled 1 mL at each site. The sites were selected along the trigone and posterior bladder wall.  A total of 20 sites were injected.  The patient's bladder was subsequently emptied and she was awoken and returned to PACU in stable  condition.

## 2024-05-04 NOTE — Transfer of Care (Signed)
 Immediate Anesthesia Transfer of Care Note  Patient: Samantha Mcgee  Procedure(s) Performed: CYSTOSCOPY, WITH INJECTION OF BLADDER NECK OR BLADDER WALL (Bladder)  Patient Location: PACU  Anesthesia Type:General  Level of Consciousness: awake, drowsy, and patient cooperative  Airway & Oxygen Therapy: Patient Spontanous Breathing and Patient connected to face mask oxygen  Post-op Assessment: Report given to RN and Post -op Vital signs reviewed and stable  Post vital signs: Reviewed and stable  Last Vitals:  Vitals Value Taken Time  BP 123/78 05/04/24 13:11  Temp    Pulse 86 05/04/24 13:12  Resp 17 05/04/24 13:12  SpO2 100 % 05/04/24 13:12  Vitals shown include unfiled device data.  Last Pain:  Vitals:   05/04/24 0931  TempSrc:   PainSc: 0-No pain         Complications: No notable events documented.

## 2024-05-04 NOTE — Anesthesia Postprocedure Evaluation (Signed)
 Anesthesia Post Note  Patient: Samantha Mcgee  Procedure(s) Performed: CYSTOSCOPY, WITH INJECTION OF BLADDER NECK OR BLADDER WALL (Bladder)     Patient location during evaluation: PACU Anesthesia Type: General Level of consciousness: awake and alert Pain management: pain level controlled Vital Signs Assessment: post-procedure vital signs reviewed and stable Respiratory status: spontaneous breathing, nonlabored ventilation, respiratory function stable and patient connected to nasal cannula oxygen Cardiovascular status: blood pressure returned to baseline and stable Postop Assessment: no apparent nausea or vomiting Anesthetic complications: no   No notable events documented.  Last Vitals:  Vitals:   05/04/24 1345 05/04/24 1354  BP: 121/79 116/81  Pulse: 79   Resp: 15   Temp: (!) 36.4 C   SpO2: 97% 98%    Last Pain:  Vitals:   05/04/24 1354  TempSrc:   PainSc: 0-No pain                 Garnette DELENA Gab

## 2024-05-04 NOTE — Anesthesia Procedure Notes (Signed)
 Procedure Name: LMA Insertion Date/Time: 05/04/2024 12:43 PM  Performed by: Metta Andrea NOVAK, CRNAPre-anesthesia Checklist: Patient identified, Emergency Drugs available, Suction available, Patient being monitored and Timeout performed Patient Re-evaluated:Patient Re-evaluated prior to induction Oxygen Delivery Method: Circle system utilized Preoxygenation: Pre-oxygenation with 100% oxygen Induction Type: IV induction Ventilation: Mask ventilation without difficulty LMA: LMA inserted LMA Size: 4.0 Number of attempts: 1 Tube secured with: Tape Dental Injury: Teeth and Oropharynx as per pre-operative assessment

## 2024-05-04 NOTE — Anesthesia Preprocedure Evaluation (Addendum)
 Anesthesia Evaluation  Patient identified by MRN, date of birth, ID band Patient awake    Reviewed: Allergy & Precautions, NPO status , Patient's Chart, lab work & pertinent test results  Airway Mallampati: II  TM Distance: >3 FB Neck ROM: Full    Dental no notable dental hx.    Pulmonary asthma    Pulmonary exam normal        Cardiovascular negative cardio ROS  Rhythm:Regular Rate:Normal     Neuro/Psych  Headaches  Anxiety        GI/Hepatic Neg liver ROS,GERD  Medicated,,  Endo/Other  negative endocrine ROS    Renal/GU negative Renal ROS  negative genitourinary   Musculoskeletal  (+) Arthritis , Osteoarthritis,    Abdominal Normal abdominal exam  (+)   Peds  Hematology Lab Results      Component                Value               Date                      WBC                      9.2                 08/11/2023                HGB                      13.9                08/11/2023                HCT                      43.7                08/11/2023                MCV                      88                  08/11/2023                PLT                      349                 08/11/2023             Lab Results      Component                Value               Date                      NA                       140                 01/16/2024                K  4.6                 01/16/2024                CO2                      22                  01/16/2024                GLUCOSE                  87                  01/16/2024                BUN                      9                   01/16/2024                CREATININE               0.85                01/16/2024                CALCIUM                  9.4                 01/16/2024                EGFR                     80                  01/16/2024                GFRNONAA                 96                  07/03/2020               Anesthesia Other Findings   Reproductive/Obstetrics                              Anesthesia Physical Anesthesia Plan  ASA: 2  Anesthesia Plan: General   Post-op Pain Management:    Induction: Intravenous  PONV Risk Score and Plan: 3 and Ondansetron , Dexamethasone , Midazolam  and Treatment may vary due to age or medical condition  Airway Management Planned: Mask and LMA  Additional Equipment: None  Intra-op Plan:   Post-operative Plan: Extubation in OR  Informed Consent: I have reviewed the patients History and Physical, chart, labs and discussed the procedure including the risks, benefits and alternatives for the proposed anesthesia with the patient or authorized representative who has indicated his/her understanding and acceptance.       Plan Discussed with: CRNA  Anesthesia Plan Comments:          Anesthesia Quick Evaluation

## 2024-05-04 NOTE — H&P (Signed)
 History of present illness: The patient presents today for annual evaluation. I been treating her for overactive bladder. We tried Myrbetriq, whichshe was taking last time she was here, but it did not seem to be helping her. What was helping her most was physical therapy. She was doing some sacral dry needling as well. She was still wearing a thin pad, but overall had an significant permit. Since she was last here she has had some significant stress in her life, has not been doing her exercises, and has had progressive symptoms. She is still not taking Myrbetriq. She is not doing a lot of her exercises either. She wears a thick pad when she drives long distances or flies. She denies any leakage with coughing, laughing and sneezing. The patient is complaining of some left-sided flank pain. Has been present for the last 2 days. She does have a significant history for nephrolithiasis  Review of systems: A 12 point comprehensive review of systems was obtained and is negative unless otherwise stated in the history of present illness.  Patient Active Problem List   Diagnosis Date Noted   Chronic cough 01/10/2024   Mild persistent asthma, uncomplicated 01/10/2024   Gastroesophageal reflux disease without esophagitis 01/09/2024   Encounter for general adult medical examination w/o abnormal findings 08/11/2023   Vitamin D  deficiency disease 08/11/2023   Left leg pain 02/16/2023   Class 2 severe obesity due to excess calories with serious comorbidity and body mass index (BMI) of 35.0 to 35.9 in adult 02/16/2023   Prediabetes 01/10/2023   Chronic pain of both knees 01/10/2023   Class 1 obesity due to excess calories with body mass index (BMI) of 34.0 to 34.9 in adult 01/10/2023   Chronic insomnia 01/10/2023   Plantar fasciitis, bilateral 06/18/2018   Asthma 06/15/2018   Seasonal allergies 06/15/2018   Status post total hysterectomy 03/30/2018    No current facility-administered medications  on file prior to encounter.   Current Outpatient Medications on File Prior to Encounter  Medication Sig Dispense Refill   albuterol  (PROAIR  HFA) 108 (90 Base) MCG/ACT inhaler Use 2 puffs every 4 hours as needed for cough or wheeze.  May use 2 puffs 10-20 minutes prior to exercise. 3 each 1   amitriptyline  (ELAVIL ) 10 MG tablet TAKE 3 TABLETS BY MOUTH AT BEDTIME AS NEEDED FOR SLEEP (Patient taking differently: Take 20 mg by mouth at bedtime.) 270 tablet 1   CALCIUM PO Take 1,200 mg by mouth in the morning.     carboxymethylcellulose (REFRESH TEARS) 0.5 % SOLN Place 1-2 drops into both eyes 3 (three) times daily as needed (dry/irritated eyes.).     cetirizine  (ZYRTEC ) 10 MG tablet TAKE 1 TABLET BY MOUTH EVERY DAY FOR RUNNY NOSE OR ITCHING (Patient taking differently: Take 10 mg by mouth at bedtime.) 90 tablet 1   Cholecalciferol (VITAMIN D3 PO) Take 1 tablet by mouth in the morning.     Chromium Picolinate (CHROMIUM PICOLATE PO) Take 1 tablet by mouth in the morning.     Clindamycin-Benzoyl Per, Refr, gel Apply 1 application  topically in the morning.  3   fluticasone  (FLONASE ) 50 MCG/ACT nasal spray Place 2 sprays into both nostrils in the morning.     Misc Natural Products (NEURIVA PO) Take 1 capsule by mouth in the morning.     montelukast  (SINGULAIR ) 10 MG tablet Take 1 tablet (10 mg total) by mouth at bedtime. 30 tablet 5   Omega-3 1000 MG CAPS Take 1,000 mg  by mouth in the morning.     polyethylene glycol (MIRALAX / GLYCOLAX) 17 g packet Take 17 g by mouth in the morning.     Probiotic Product (PROBIOTIC PO) Take 1 capsule by mouth in the morning.     RETIN-A MICRO PUMP 0.08 % GEL Apply 1 application topically at bedtime.  1   TURMERIC PO Take 1 capsule by mouth in the morning.     Vibegron (GEMTESA) 75 MG TABS Take 75 mg by mouth at bedtime.     vitamin B-12 (CYANOCOBALAMIN) 500 MCG tablet Take 500 mcg by mouth in the morning.     WIXELA INHUB 250-50 MCG/ACT AEPB Inhale 1 puff into the  lungs 2 (two) times daily.     zolpidem  (AMBIEN ) 5 MG tablet TAKE 1 TABLET BY MOUTH EVERY DAY AT BEDTIME AS NEEDED 30 tablet 1   ibuprofen  (ADVIL ) 800 MG tablet Take 800 mg by mouth every 8 (eight) hours as needed (pain).      Past Medical History:  Diagnosis Date   Anxiety    Arthritis    Asthma    GERD (gastroesophageal reflux disease)    Headache    History of kidney stones    Interstitial cystitis    Seasonal allergies     Past Surgical History:  Procedure Laterality Date   BREAST BIOPSY Right 09/11/2019   fibrocystic change   CESAREAN SECTION     CHOLECYSTECTOMY N/A 10/07/2016   Procedure: LAPAROSCOPIC CHOLECYSTECTOMY WITH INTRAOPERATIVE CHOLANGIOGRAM;  Surgeon: Debby Shipper, MD;  Location: [facility] OR;  Service: General;  Laterality: N/A;   CYSTITIS     CYSTOSCOPY N/A 03/30/2018   Procedure: PHYLLIS;  Surgeon: Rutherford Gain, MD;  Location: [facility];  Service: Gynecology;  Laterality: N/A;   FOOT SURGERY     ROBOTIC ASSISTED LAPAROSCOPIC HYSTERECTOMY AND SALPINGECTOMY Bilateral 03/30/2018   Procedure: XI ROBOTIC ASSISTED LAPAROSCOPIC HYSTERECTOMY AND SALPINGECTOMY,;  Surgeon: Rutherford Gain, MD;  Location: [facility];  Service: Gynecology;  Laterality: Bilateral;  Overnight bed at [facility].    Social History   Tobacco Use   Smoking status: Never   Smokeless tobacco: Never  Vaping Use   Vaping status: Never Used  Substance Use Topics   Alcohol use: Yes    Alcohol/week: 1.0 standard drink of alcohol    Types: 1 Glasses of wine per week    Comment: weekly   Drug use: No    Family History  Problem Relation Age of Onset   Diabetes Mother    Stroke Mother    Hyperlipidemia Mother    Arthritis Father    Heart attack Father    Hyperlipidemia Father    Eczema Brother    Cancer Brother    Eczema Daughter    Allergic rhinitis Neg Hx    Angioedema Neg Hx    Asthma Neg Hx    Immunodeficiency Neg Hx    Urticaria Neg Hx     PE: Vitals:   05/04/24  0855 05/04/24 0931  BP: 129/83   Pulse: (!) 103   Resp: 16   Temp: 97.9 F (36.6 C)   TempSrc: Oral   SpO2: 98%   Weight:  86.5 kg  Height:  5' 4 (1.626 m)   Patient appears to be in no acute distress  patient is alert and oriented x3 Atraumatic normocephalic head No cervical or supraclavicular lymphadenopathy appreciated No increased work of breathing, no audible wheezes/rhonchi Regular sinus rhythm/rate Abdomen is soft, nontender, nondistended, no CVA  or suprapubic tenderness Lower extremities are symmetric without appreciable edema Grossly neurologically intact No identifiable skin lesions  No results for input(s): WBC, HGB, HCT in the last 72 hours. No results for input(s): NA, K, CL, CO2, GLUCOSE, BUN, CREATININE, CALCIUM in the last 72 hours. No results for input(s): LABPT, INR in the last 72 hours. No results for input(s): LABURIN in the last 72 hours. Results for orders placed or performed during the hospital encounter of 06/10/12  Urine culture     Status: None   Collection Time: 06/10/12  9:49 AM   Specimen: Urine, Clean Catch  Result Value Ref Range Status   Specimen Description URINE, CLEAN CATCH  Final   Special Requests NONE  Final   Culture  Setup Time 06/10/2012 18:19  Final   Colony Count 20,OOO COLONIES/ML  Final   Culture   Final    Multiple bacterial morphotypes present, none predominant. Suggest appropriate recollection if clinically indicated.   Report Status 06/11/2012 FINAL  Final    Imaging: none  Imp:  OAB with refractory incontinence.  Recommendations:  Proceed with botox instillation as scheduled.  Morene LELON Salines

## 2024-05-05 ENCOUNTER — Encounter (HOSPITAL_COMMUNITY): Payer: Self-pay | Admitting: Urology

## 2024-06-21 ENCOUNTER — Emergency Department (HOSPITAL_BASED_OUTPATIENT_CLINIC_OR_DEPARTMENT_OTHER)
Admission: EM | Admit: 2024-06-21 | Discharge: 2024-06-21 | Disposition: A | Discharge status: Home / Self Care | Attending: Emergency Medicine | Admitting: Emergency Medicine

## 2024-06-21 ENCOUNTER — Encounter (HOSPITAL_BASED_OUTPATIENT_CLINIC_OR_DEPARTMENT_OTHER): Payer: Self-pay

## 2024-06-21 DIAGNOSIS — R109 Unspecified abdominal pain: Secondary | ICD-10-CM | POA: Insufficient documentation

## 2024-06-21 DIAGNOSIS — R112 Nausea with vomiting, unspecified: Secondary | ICD-10-CM | POA: Diagnosis present

## 2024-06-21 DIAGNOSIS — Z7951 Long term (current) use of inhaled steroids: Secondary | ICD-10-CM | POA: Diagnosis not present

## 2024-06-21 DIAGNOSIS — J45909 Unspecified asthma, uncomplicated: Secondary | ICD-10-CM | POA: Insufficient documentation

## 2024-06-21 LAB — LIPASE, BLOOD: Lipase: 24 U/L (ref 11–51)

## 2024-06-21 LAB — COMPREHENSIVE METABOLIC PANEL WITH GFR
ALT: 13 U/L (ref 0–44)
AST: 16 U/L (ref 15–41)
Albumin: 4 g/dL (ref 3.5–5.0)
Alkaline Phosphatase: 94 U/L (ref 38–126)
Anion gap: 13 (ref 5–15)
BUN: 13 mg/dL (ref 6–20)
CO2: 25 mmol/L (ref 22–32)
Calcium: 9.6 mg/dL (ref 8.9–10.3)
Chloride: 102 mmol/L (ref 98–111)
Creatinine, Ser: 0.81 mg/dL (ref 0.44–1.00)
GFR, Estimated: >60 mL/min (ref >60)
Glucose, Bld: 110 mg/dL — ABNORMAL HIGH (ref 70–99)
Potassium: 3.7 mmol/L (ref 3.5–5.1)
Sodium: 139 mmol/L (ref 135–145)
Total Bilirubin: 0.3 mg/dL (ref 0.0–1.2)
Total Protein: 7.2 g/dL (ref 6.5–8.1)

## 2024-06-21 LAB — CBC
HCT: 39.6 % (ref 36.0–46.0)
Hemoglobin: 13.1 g/dL (ref 12.0–15.0)
MCH: 27.5 pg (ref 26.0–34.0)
MCHC: 33.1 g/dL (ref 30.0–36.0)
MCV: 83.2 fL (ref 80.0–100.0)
Platelets: 285 K/uL (ref 150–400)
RBC: 4.76 MIL/uL (ref 3.87–5.11)
RDW: 13 % (ref 11.5–15.5)
WBC: 14.2 K/uL — ABNORMAL HIGH (ref 4.0–10.5)
nRBC: 0 % (ref 0.0–0.2)

## 2024-06-21 LAB — URINALYSIS, ROUTINE W REFLEX MICROSCOPIC
Bilirubin Urine: NEGATIVE
Glucose, UA: NEGATIVE mg/dL
Hgb urine dipstick: NEGATIVE
Ketones, ur: NEGATIVE mg/dL
Leukocytes,Ua: NEGATIVE
Nitrite: NEGATIVE
Specific Gravity, Urine: 1.015 (ref 1.005–1.030)
pH: 8 (ref 5.0–8.0)

## 2024-06-21 MED ORDER — SODIUM CHLORIDE 0.9 % IV BOLUS
1000.0000 mL | Freq: Once | INTRAVENOUS | Status: AC
  Administered 2024-06-21: 1000 mL via INTRAVENOUS

## 2024-06-21 MED ORDER — ONDANSETRON HCL 4 MG/2ML IJ SOLN
4.0000 mg | Freq: Once | INTRAMUSCULAR | Status: AC
  Administered 2024-06-21: 4 mg via INTRAVENOUS
  Filled 2024-06-21: qty 2

## 2024-06-21 NOTE — ED Notes (Signed)
 Pt ambulatory in hall to restroom without assistance, gait steady, denies nausea or abdominal pain. Urine specimen cup provided and pt verbalizes understanding of same.

## 2024-06-21 NOTE — ED Triage Notes (Signed)
 Pt via [facility] for eval of single episode of NV prior to EMS arrival. Ems advises she had hotdogs yesterday, today. Advsies intermittent cramping No meds PTA

## 2024-06-21 NOTE — ED Provider Notes (Signed)
 [place] EMERGENCY DEPARTMENT AT [facility] Provider Note   CSN: [id number] Arrival date & time: 06/21/24  9952     Patient presents with: No chief complaint on file.   Samantha Mcgee is a 58 y.o. female.   Patient is a 58 year old female with history of prior cholecystectomy, GERD, asthma, hysterectomy.  Patient presenting today with complaints of abdominal cramping and vomiting.  Symptoms began just prior to arrival.  She reports eating 2 hotdogs this evening, then developed abdominal cramping and nausea.  She called 911 and was transported here.  Patient was given Zofran  per protocol and now symptoms have completely resolved.  She denies any abdominal discomfort or nausea.  No fevers or chills.  She felt extremely bloated during this episode, but this has since improved.       Prior to Admission medications  Medication Sig Start Date End Date Taking? Authorizing Provider  albuterol  (PROAIR  HFA) 108 (90 Base) MCG/ACT inhaler Use 2 puffs every 4 hours as needed for cough or wheeze.  May use 2 puffs 10-20 minutes prior to exercise. 01/09/24   Jarold Medici, MD  amitriptyline  (ELAVIL ) 10 MG tablet TAKE 3 TABLETS BY MOUTH AT BEDTIME AS NEEDED FOR SLEEP Patient taking differently: Take 20 mg by mouth at bedtime. 02/20/24   Jarold Medici, MD  CALCIUM PO Take 1,200 mg by mouth in the morning.    [provider]  carboxymethylcellulose (REFRESH TEARS) 0.5 % SOLN Place 1-2 drops into both eyes 3 (three) times daily as needed (dry/irritated eyes.).    [provider]  cetirizine  (ZYRTEC ) 10 MG tablet TAKE 1 TABLET BY MOUTH EVERY DAY FOR RUNNY NOSE OR ITCHING Patient taking differently: Take 10 mg by mouth at bedtime. 03/31/17   Jeneal Danita Macintosh, MD  Cholecalciferol (VITAMIN D3 PO) Take 1 tablet by mouth in the morning.    [provider]  Chromium Picolinate (CHROMIUM PICOLATE PO) Take 1 tablet by mouth in the morning.    [provider]  Clindamycin-Benzoyl Per, Refr, gel Apply 1 application  topically in the morning. 12/24/17   [provider]  fluticasone  (FLONASE ) 50 MCG/ACT nasal spray Place 2 sprays into both nostrils in the morning.    [provider]  ibuprofen  (ADVIL ) 800 MG tablet Take 800 mg by mouth every 8 (eight) hours as needed (pain).    [provider]  Misc Natural Products (NEURIVA PO) Take 1 capsule by mouth in the morning.    [provider]  montelukast  (SINGULAIR ) 10 MG tablet Take 1 tablet (10 mg total) by mouth at bedtime. 10/20/17   Jeneal Danita Macintosh, MD  Omega-3 1000 MG CAPS Take 1,000 mg by mouth in the morning.    [provider]  omeprazole (PRILOSEC) 40 MG capsule TAKE 1 CAPSULE BY MOUTH TWICE A DAY 30 MINUTES BEFORE MORNING AND EVENING MEALS Patient taking differently: Take 40 mg by mouth daily before breakfast. 04/23/24   Jarold Medici, MD  polyethylene glycol (MIRALAX / GLYCOLAX) 17 g packet Take 17 g by mouth in the morning.    [provider]  Probiotic Product (PROBIOTIC PO) Take 1 capsule by mouth in the morning.    [provider]  RETIN-A MICRO PUMP 0.08 % GEL Apply 1 application topically at bedtime. 03/08/18   [provider]  TURMERIC PO Take 1 capsule by mouth in the morning.    [provider]  Vibegron (GEMTESA) 75 MG TABS Take 75 mg by mouth at  bedtime.    [provider]  vitamin B-12 (CYANOCOBALAMIN) 500 MCG tablet Take 500 mcg by mouth in the morning.    [provider]  NAPOLEON INHUB 250-50 MCG/ACT AEPB Inhale 1 puff into the lungs 2 (two) times daily.    [provider]  zolpidem  (AMBIEN ) 5 MG tablet TAKE 1 TABLET BY MOUTH EVERY DAY AT BEDTIME AS NEEDED 05/03/23   Jarold Medici, MD    Allergies: Latex    Review of Systems  All other systems reviewed and are negative.   Updated Vital Signs BP 108/76   Pulse 92   Temp 97.9 F (36.6 C)   Resp 18   LMP  03/06/2018   SpO2 98%   Physical Exam Vitals and nursing note reviewed.  Constitutional:      General: She is not in acute distress.    Appearance: She is well-developed. She is not diaphoretic.  HENT:     Head: Normocephalic and atraumatic.  Cardiovascular:     Rate and Rhythm: Normal rate and regular rhythm.     Heart sounds: No murmur heard.    No friction rub. No gallop.  Pulmonary:     Effort: Pulmonary effort is normal. No respiratory distress.     Breath sounds: Normal breath sounds. No wheezing.  Abdominal:     General: Bowel sounds are normal. There is no distension.     Palpations: Abdomen is soft.     Tenderness: There is no abdominal tenderness.  Musculoskeletal:        General: Normal range of motion.     Cervical back: Normal range of motion and neck supple.  Skin:    General: Skin is warm and dry.  Neurological:     General: No focal deficit present.     Mental Status: She is alert and oriented to person, place, and time.     (all labs ordered are listed, but only abnormal results are displayed) Labs Reviewed  COMPREHENSIVE METABOLIC PANEL WITH GFR - Abnormal; Notable for the following components:      Result Value   Glucose, Bld 110 (*)    All other components within normal limits  CBC - Abnormal; Notable for the following components:   WBC 14.2 (*)    All other components within normal limits  LIPASE, BLOOD  URINALYSIS, ROUTINE W REFLEX MICROSCOPIC    EKG: None  Radiology: No results found.   Procedures   Medications Ordered in the ED  ondansetron  (ZOFRAN ) injection 4 mg (4 mg Intravenous Given 06/21/24 0101)                                    Medical Decision Making Amount and/or Complexity of Data Reviewed Labs: ordered.  Risk Prescription drug management.   Patient is a 58 year old female presenting with abdominal pain and vomiting as described in the HPI.  Her symptoms came on acutely after eating a hot dog this evening.  She  felt bloated.  She was given Zofran  here and symptoms have completely resolved.  Her abdomen is benign and she is afebrile.  CBC, CMP, and lipase obtained.  White count 14,000, but studies otherwise unremarkable.  At the time of my evaluation, patient was resting comfortably and stated that she felt back to normal.  I am uncertain as to the exact etiology of her symptoms, but suspect possibly a foodborne issue.  She is asymptomatic and  I believe can safely be discharged.     Final diagnoses:  None    ED Discharge Orders     None          Geroldine Berg, MD 06/21/24 [phone]

## 2024-06-21 NOTE — Discharge Instructions (Signed)
 Clear liquids for the next 12 hours, then slowly advance diet as tolerated.  Return to the ER if you develop worsening pain, high fevers, bloody stool, or for other new and concerning symptoms.

## 2024-08-14 ENCOUNTER — Other Ambulatory Visit: Payer: Self-pay | Admitting: Internal Medicine

## 2024-08-14 ENCOUNTER — Encounter: Payer: BC Managed Care – PPO | Admitting: Internal Medicine

## 2024-08-14 ENCOUNTER — Encounter: Payer: Self-pay | Admitting: Internal Medicine

## 2024-08-14 ENCOUNTER — Ambulatory Visit: Admitting: Internal Medicine

## 2024-08-14 VITALS — BP 118/78 | HR 98 | Temp 98.1°F | Ht 64.0 in | Wt 194.8 lb

## 2024-08-14 DIAGNOSIS — H6121 Impacted cerumen, right ear: Secondary | ICD-10-CM

## 2024-08-14 DIAGNOSIS — K219 Gastro-esophageal reflux disease without esophagitis: Secondary | ICD-10-CM | POA: Diagnosis not present

## 2024-08-14 DIAGNOSIS — Z8249 Family history of ischemic heart disease and other diseases of the circulatory system: Secondary | ICD-10-CM | POA: Diagnosis not present

## 2024-08-14 DIAGNOSIS — R413 Other amnesia: Secondary | ICD-10-CM | POA: Diagnosis not present

## 2024-08-14 DIAGNOSIS — Z0000 Encounter for general adult medical examination without abnormal findings: Secondary | ICD-10-CM | POA: Diagnosis not present

## 2024-08-14 DIAGNOSIS — Z79899 Other long term (current) drug therapy: Secondary | ICD-10-CM

## 2024-08-14 DIAGNOSIS — Z1231 Encounter for screening mammogram for malignant neoplasm of breast: Secondary | ICD-10-CM

## 2024-08-14 NOTE — Assessment & Plan Note (Deleted)
 Samantha Mcgee

## 2024-08-14 NOTE — Patient Instructions (Signed)
 Health Maintenance, Female Adopting a healthy lifestyle and getting preventive care are important in promoting health and wellness. Ask your health care provider about: The right schedule for you to have regular tests and exams. Things you can do on your own to prevent diseases and keep yourself healthy. What should I know about diet, weight, and exercise? Eat a healthy diet  Eat a diet that includes plenty of vegetables, fruits, low-fat dairy products, and lean protein. Do not eat a lot of foods that are high in solid fats, added sugars, or sodium. Maintain a healthy weight Body mass index (BMI) is used to identify weight problems. It estimates body fat based on height and weight. Your health care provider can help determine your BMI and help you achieve or maintain a healthy weight. Get regular exercise Get regular exercise. This is one of the most important things you can do for your health. Most adults should: Exercise for at least 150 minutes each week. The exercise should increase your heart rate and make you sweat (moderate-intensity exercise). Do strengthening exercises at least twice a week. This is in addition to the moderate-intensity exercise. Spend less time sitting. Even light physical activity can be beneficial. Watch cholesterol and blood lipids Have your blood tested for lipids and cholesterol at 58 years of age, then have this test every 5 years. Have your cholesterol levels checked more often if: Your lipid or cholesterol levels are high. You are older than 58 years of age. You are at high risk for heart disease. What should I know about cancer screening? Depending on your health history and family history, you may need to have cancer screening at various ages. This may include screening for: Breast cancer. Cervical cancer. Colorectal cancer. Skin cancer. Lung cancer. What should I know about heart disease, diabetes, and high blood pressure? Blood pressure and heart  disease High blood pressure causes heart disease and increases the risk of stroke. This is more likely to develop in people who have high blood pressure readings or are overweight. Have your blood pressure checked: Every 3-5 years if you are 32-37 years of age. Every year if you are 71 years old or older. Diabetes Have regular diabetes screenings. This checks your fasting blood sugar level. Have the screening done: Once every three years after age 24 if you are at a normal weight and have a low risk for diabetes. More often and at a younger age if you are overweight or have a high risk for diabetes. What should I know about preventing infection? Hepatitis B If you have a higher risk for hepatitis B, you should be screened for this virus. Talk with your health care provider to find out if you are at risk for hepatitis B infection. Hepatitis C Testing is recommended for: Everyone born from [birth year] through 1964. Anyone with known risk factors for hepatitis C. Sexually transmitted infections (STIs) Get screened for STIs, including gonorrhea and chlamydia, if: You are sexually active and are younger than 58 years of age. You are older than 58 years of age and your health care provider tells you that you are at risk for this type of infection. Your sexual activity has changed since you were last screened, and you are at increased risk for chlamydia or gonorrhea. Ask your health care provider if you are at risk. Ask your health care provider about whether you are at high risk for HIV. Your health care provider may recommend a prescription medicine to help prevent HIV  infection. If you choose to take medicine to prevent HIV, you should first get tested for HIV. You should then be tested every 3 months for as long as you are taking the medicine. Pregnancy If you are about to stop having your period (premenopausal) and you may become pregnant, seek counseling before you get pregnant. Take 400 to 800  micrograms (mcg) of folic acid every day if you become pregnant. Ask for birth control (contraception) if you want to prevent pregnancy. Osteoporosis and menopause Osteoporosis is a disease in which the bones lose minerals and strength with aging. This can result in bone fractures. If you are 42 years old or older, or if you are at risk for osteoporosis and fractures, ask your health care provider if you should: Be screened for bone loss. Take a calcium  or vitamin D  supplement to lower your risk of fractures. Be given hormone replacement therapy (HRT) to treat symptoms of menopause. Follow these instructions at home: Alcohol use Do not drink alcohol if: Your health care provider tells you not to drink. You are pregnant, may be pregnant, or are planning to become pregnant. If you drink alcohol: Limit how much you have to: 0-1 drink a day. Know how much alcohol is in your drink. In the [place] one drink equals one 12 oz bottle of beer (355 mL), one 5 oz glass of wine (148 mL), or one 1 oz glass of hard liquor (44 mL). Lifestyle Do not use any products that contain nicotine or tobacco. These products include cigarettes, chewing tobacco, and vaping devices, such as e-cigarettes. If you need help quitting, ask your health care provider. Do not use street drugs. Do not share needles. Ask your health care provider for help if you need support or information about quitting drugs. General instructions Schedule regular health, dental, and eye exams. Stay current with your vaccines. Tell your health care provider if: You often feel depressed. You have ever been abused or do not feel safe at home. This information is not intended to replace advice given to you by your health care provider. Make sure you discuss any questions you have with your health care provider. Document Revised: 01/04/2024 Document Reviewed: 11/17/2020 Elsevier Patient Education  2025 [employer].

## 2024-08-14 NOTE — Assessment & Plan Note (Addendum)
 Reports improvement with digestive enzymes but continues late-night eating, potentially worsening symptoms. - Continue digestive enzymes as needed for bloating. - Stop eating 3 hrs prior to lying down - Continue with omeprazole once daily - Encouraged regular exercise and healthy eating habits.

## 2024-08-14 NOTE — Assessment & Plan Note (Addendum)
 A full exam was performed.  Importance of monthly self breast exams was discussed with the patient.  She is advised to get 30-45 minutes of regular exercise, no less than four to five days per week. Both weight-bearing and aerobic exercises are recommended.  She is advised to follow a healthy diet with at least six fruits/veggies per day, decrease intake of red meat and other saturated fats and to increase fish intake to twice weekly.  Meats/fish should not be fried -- baked, boiled or broiled is preferable. It is also important to cut back on your sugar intake.  Be sure to read labels - try to avoid anything with added sugar, high fructose corn syrup or other sweeteners.  If you must use a sweetener, you can try stevia or monkfruit.  It is also important to avoid artificially sweetened foods/beverages and diet drinks. Lastly, wear SPF 50 sunscreen on exposed skin and when in direct sunlight for an extended period of time.  Be sure to avoid fast food restaurants and aim for at least 60 ounces of water  daily.     Orders:   CBC   CMP14+EGFR   Lipid panel   Hemoglobin A1c   TSH

## 2024-08-14 NOTE — Assessment & Plan Note (Deleted)
 SABRA

## 2024-08-17 ENCOUNTER — Ambulatory Visit: Admission: RE | Admit: 2024-08-17 | Source: Ambulatory Visit

## 2024-08-17 ENCOUNTER — Ambulatory Visit

## 2024-08-17 ENCOUNTER — Ambulatory Visit: Payer: Self-pay

## 2024-08-17 ENCOUNTER — Other Ambulatory Visit: Payer: Self-pay

## 2024-08-17 VITALS — BP 120/70 | HR 85 | Temp 98.5°F | Ht 64.0 in | Wt 194.0 lb

## 2024-08-17 DIAGNOSIS — Z1231 Encounter for screening mammogram for malignant neoplasm of breast: Secondary | ICD-10-CM

## 2024-08-17 DIAGNOSIS — E538 Deficiency of other specified B group vitamins: Secondary | ICD-10-CM

## 2024-08-17 MED ORDER — CYANOCOBALAMIN 1000 MCG/ML IJ SOLN
1000.0000 ug | Freq: Once | INTRAMUSCULAR | Status: AC
  Administered 2024-08-17: 1000 ug via INTRAMUSCULAR

## 2024-08-17 NOTE — Progress Notes (Signed)
 Patient is in office today for a nurse visit for B12 Injection and labs. Patient Injection was given in the  Right deltoid. Patient tolerated injection well.

## 2024-08-28 ENCOUNTER — Ambulatory Visit (HOSPITAL_COMMUNITY)

## 2024-08-30 ENCOUNTER — Other Ambulatory Visit (HOSPITAL_COMMUNITY)
